# Patient Record
Sex: Female | Born: 1975
Health system: Southern US, Community
[De-identification: ages and names within clinical notes are randomized; demographics above are authoritative.]

## PROBLEM LIST (undated history)

## (undated) DIAGNOSIS — G8929 Other chronic pain: Secondary | ICD-10-CM

## (undated) DIAGNOSIS — K219 Gastro-esophageal reflux disease without esophagitis: Secondary | ICD-10-CM

## (undated) DIAGNOSIS — M549 Dorsalgia, unspecified: Secondary | ICD-10-CM

## (undated) DIAGNOSIS — J40 Bronchitis, not specified as acute or chronic: Secondary | ICD-10-CM

## (undated) DIAGNOSIS — L309 Dermatitis, unspecified: Secondary | ICD-10-CM

## (undated) DIAGNOSIS — F329 Major depressive disorder, single episode, unspecified: Secondary | ICD-10-CM

## (undated) DIAGNOSIS — F32A Depression, unspecified: Secondary | ICD-10-CM

## (undated) HISTORY — PX: LEG SURGERY: SHX1003

---

## 2002-08-04 ENCOUNTER — Emergency Department (HOSPITAL_COMMUNITY): Admission: EM | Admit: 2002-08-04 | Discharge: 2002-08-04 | Payer: Self-pay | Admitting: Emergency Medicine

## 2002-10-19 ENCOUNTER — Emergency Department (HOSPITAL_COMMUNITY): Admission: EM | Admit: 2002-10-19 | Discharge: 2002-10-19 | Payer: Self-pay | Admitting: Emergency Medicine

## 2002-10-23 ENCOUNTER — Ambulatory Visit (HOSPITAL_COMMUNITY): Admission: RE | Admit: 2002-10-23 | Discharge: 2002-10-23 | Payer: Self-pay

## 2002-11-02 ENCOUNTER — Emergency Department (HOSPITAL_COMMUNITY): Admission: EM | Admit: 2002-11-02 | Discharge: 2002-11-02 | Payer: Self-pay | Admitting: Emergency Medicine

## 2002-11-04 ENCOUNTER — Inpatient Hospital Stay (HOSPITAL_COMMUNITY): Admission: AD | Admit: 2002-11-04 | Discharge: 2002-11-04 | Payer: Self-pay

## 2002-12-06 ENCOUNTER — Encounter: Payer: Self-pay | Admitting: *Deleted

## 2002-12-06 ENCOUNTER — Ambulatory Visit (HOSPITAL_COMMUNITY): Admission: RE | Admit: 2002-12-06 | Discharge: 2002-12-06 | Payer: Self-pay

## 2002-12-20 ENCOUNTER — Inpatient Hospital Stay (HOSPITAL_COMMUNITY): Admission: AD | Admit: 2002-12-20 | Discharge: 2002-12-20 | Payer: Self-pay

## 2002-12-29 ENCOUNTER — Inpatient Hospital Stay (HOSPITAL_COMMUNITY): Admission: AD | Admit: 2002-12-29 | Discharge: 2002-12-29 | Payer: Self-pay

## 2003-01-30 ENCOUNTER — Inpatient Hospital Stay (HOSPITAL_COMMUNITY): Admission: AD | Admit: 2003-01-30 | Discharge: 2003-01-30 | Payer: Self-pay

## 2003-03-07 ENCOUNTER — Ambulatory Visit (HOSPITAL_COMMUNITY): Admission: RE | Admit: 2003-03-07 | Discharge: 2003-03-07 | Payer: Self-pay

## 2003-03-27 ENCOUNTER — Encounter: Admission: RE | Admit: 2003-03-27 | Discharge: 2003-03-27 | Payer: Self-pay

## 2003-04-01 ENCOUNTER — Inpatient Hospital Stay (HOSPITAL_COMMUNITY): Admission: AD | Admit: 2003-04-01 | Discharge: 2003-04-01 | Payer: Self-pay

## 2003-04-03 ENCOUNTER — Encounter: Admission: RE | Admit: 2003-04-03 | Discharge: 2003-04-03 | Payer: Self-pay

## 2003-04-07 ENCOUNTER — Inpatient Hospital Stay (HOSPITAL_COMMUNITY): Admission: AD | Admit: 2003-04-07 | Discharge: 2003-04-09 | Payer: Self-pay

## 2003-10-08 ENCOUNTER — Other Ambulatory Visit: Admission: RE | Admit: 2003-10-08 | Discharge: 2003-10-08 | Payer: Self-pay | Admitting: Obstetrics and Gynecology

## 2003-10-23 ENCOUNTER — Ambulatory Visit (HOSPITAL_COMMUNITY): Admission: RE | Admit: 2003-10-23 | Discharge: 2003-10-23 | Payer: Self-pay | Admitting: Obstetrics and Gynecology

## 2004-08-23 ENCOUNTER — Other Ambulatory Visit: Admission: RE | Admit: 2004-08-23 | Discharge: 2004-08-23 | Payer: Self-pay | Admitting: Obstetrics and Gynecology

## 2004-11-25 ENCOUNTER — Emergency Department (HOSPITAL_COMMUNITY): Admission: EM | Admit: 2004-11-25 | Discharge: 2004-11-25 | Payer: Self-pay | Admitting: Emergency Medicine

## 2005-12-17 ENCOUNTER — Emergency Department (HOSPITAL_COMMUNITY): Admission: EM | Admit: 2005-12-17 | Discharge: 2005-12-17 | Payer: Self-pay | Admitting: Emergency Medicine

## 2006-05-05 ENCOUNTER — Emergency Department (HOSPITAL_COMMUNITY): Admission: EM | Admit: 2006-05-05 | Discharge: 2006-05-05 | Payer: Self-pay | Admitting: Family Medicine

## 2006-11-29 ENCOUNTER — Emergency Department (HOSPITAL_COMMUNITY): Admission: EM | Admit: 2006-11-29 | Discharge: 2006-11-29 | Payer: Self-pay | Admitting: Emergency Medicine

## 2006-12-28 ENCOUNTER — Emergency Department (HOSPITAL_COMMUNITY): Admission: EM | Admit: 2006-12-28 | Discharge: 2006-12-28 | Payer: Self-pay | Admitting: Emergency Medicine

## 2007-02-06 ENCOUNTER — Inpatient Hospital Stay (HOSPITAL_COMMUNITY): Admission: EM | Admit: 2007-02-06 | Discharge: 2007-02-09 | Payer: Self-pay | Admitting: Emergency Medicine

## 2007-07-17 ENCOUNTER — Emergency Department (HOSPITAL_COMMUNITY): Admission: EM | Admit: 2007-07-17 | Discharge: 2007-07-17 | Payer: Self-pay | Admitting: Emergency Medicine

## 2007-07-22 ENCOUNTER — Emergency Department (HOSPITAL_COMMUNITY): Admission: EM | Admit: 2007-07-22 | Discharge: 2007-07-22 | Payer: Self-pay | Admitting: Emergency Medicine

## 2007-08-25 ENCOUNTER — Emergency Department (HOSPITAL_COMMUNITY): Admission: EM | Admit: 2007-08-25 | Discharge: 2007-08-25 | Payer: Self-pay | Admitting: Emergency Medicine

## 2007-09-07 ENCOUNTER — Emergency Department (HOSPITAL_COMMUNITY): Admission: EM | Admit: 2007-09-07 | Discharge: 2007-09-07 | Payer: Self-pay | Admitting: Emergency Medicine

## 2007-09-28 ENCOUNTER — Emergency Department (HOSPITAL_COMMUNITY): Admission: EM | Admit: 2007-09-28 | Discharge: 2007-09-29 | Payer: Self-pay | Admitting: Emergency Medicine

## 2007-10-15 ENCOUNTER — Emergency Department (HOSPITAL_COMMUNITY): Admission: EM | Admit: 2007-10-15 | Discharge: 2007-10-15 | Payer: Self-pay | Admitting: Emergency Medicine

## 2008-06-04 ENCOUNTER — Inpatient Hospital Stay (HOSPITAL_COMMUNITY): Admission: EM | Admit: 2008-06-04 | Discharge: 2008-06-10 | Payer: Self-pay | Admitting: Emergency Medicine

## 2008-06-04 ENCOUNTER — Emergency Department (HOSPITAL_BASED_OUTPATIENT_CLINIC_OR_DEPARTMENT_OTHER): Admission: EM | Admit: 2008-06-04 | Discharge: 2008-06-04 | Payer: Self-pay | Admitting: Emergency Medicine

## 2008-06-27 ENCOUNTER — Emergency Department (HOSPITAL_COMMUNITY): Admission: EM | Admit: 2008-06-27 | Discharge: 2008-06-28 | Payer: Self-pay | Admitting: Emergency Medicine

## 2008-07-15 ENCOUNTER — Emergency Department (HOSPITAL_COMMUNITY): Admission: EM | Admit: 2008-07-15 | Discharge: 2008-07-15 | Payer: Self-pay | Admitting: Emergency Medicine

## 2009-06-14 ENCOUNTER — Observation Stay (HOSPITAL_COMMUNITY): Admission: EM | Admit: 2009-06-14 | Discharge: 2009-06-14 | Payer: Self-pay | Admitting: Emergency Medicine

## 2009-07-08 ENCOUNTER — Observation Stay (HOSPITAL_COMMUNITY): Admission: EM | Admit: 2009-07-08 | Discharge: 2009-07-09 | Payer: Self-pay | Admitting: Emergency Medicine

## 2009-11-15 IMAGING — CT CT ANGIO CHEST
2 of 5 series · 19 of 36 positions shown · IV contrast (omnipaque)
Comparison: Chest x-ray of same date.

CLINICAL DATA: Shortness of breath.  Recent leg surgery.

CT ANGIOGRAPHY CHEST
TECHNIQUE: Multidetector CT imaging of the chest using the
standard protocol during bolus administration of intravenous
contrast. Multiplanar reconstructed images obtained and reviewed to
evaluate the vascular anatomy.
Contrast: 100 ml Omnipaque 300

[Series 7: pulm embolism 1.0 thins · axial · 0.59mm/px · z∈[-226,+17]mm · 16 of 273 slices shown]
[im 15/273  lung]
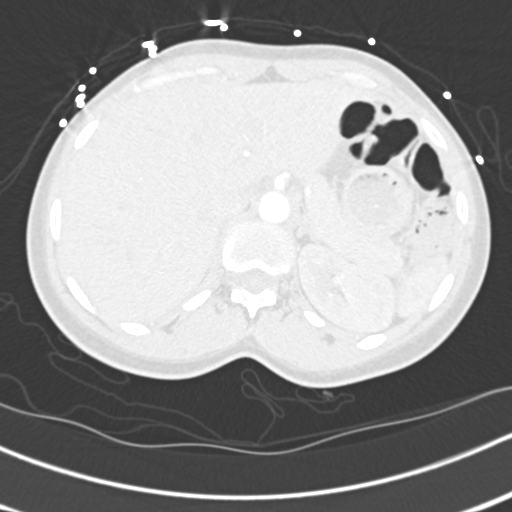
[im 29/273  mediastinal]
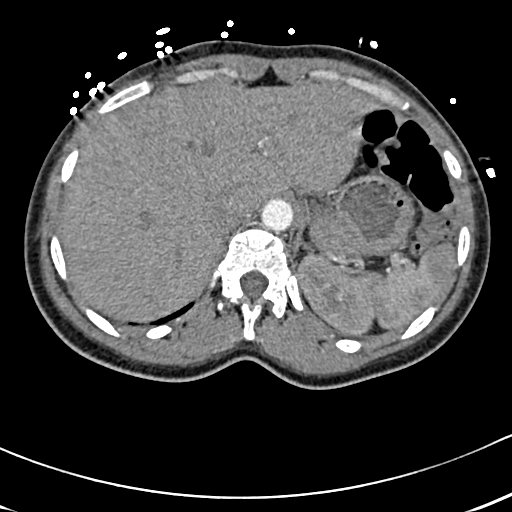
[im 43/273  lung]
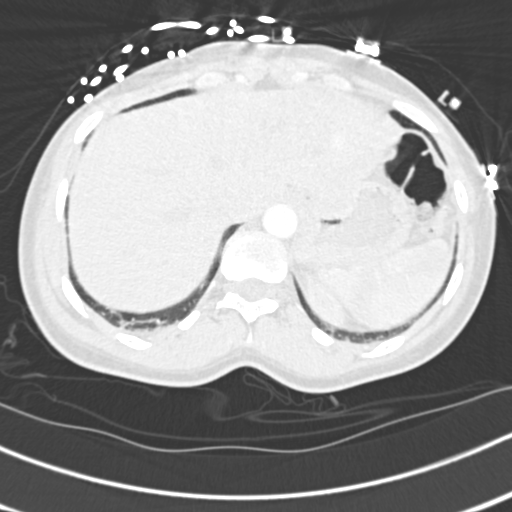
[im 58/273  mediastinal]
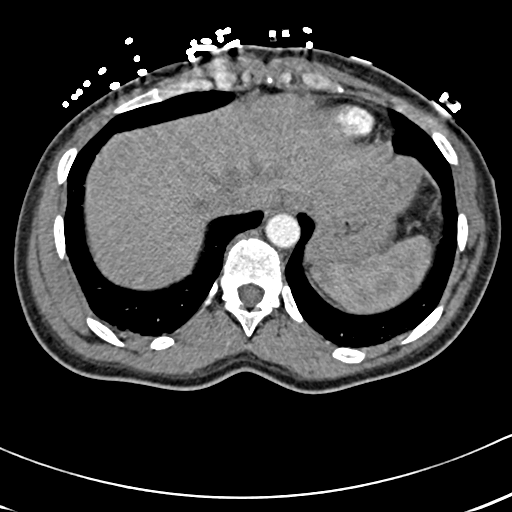
[im 86/273  lung]
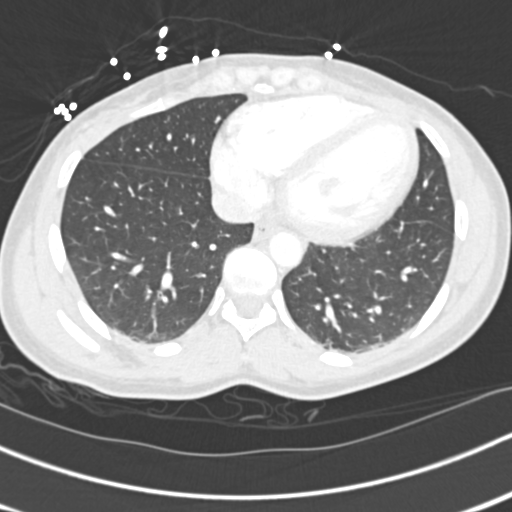
[im 101/273  mediastinal]
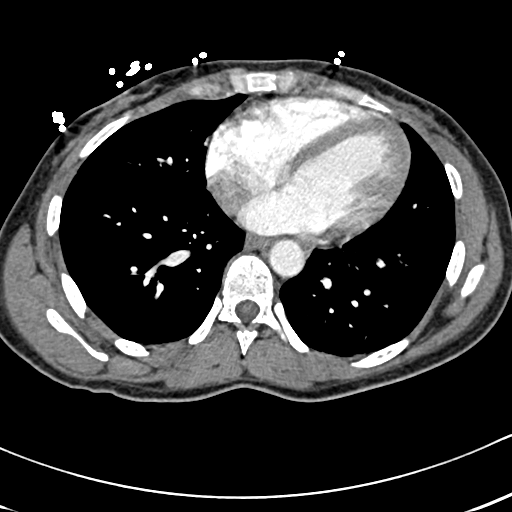
[im 115/273  lung]
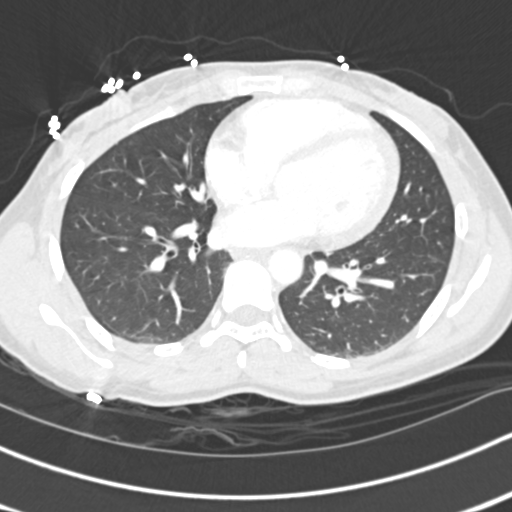
[im 129/273  mediastinal]
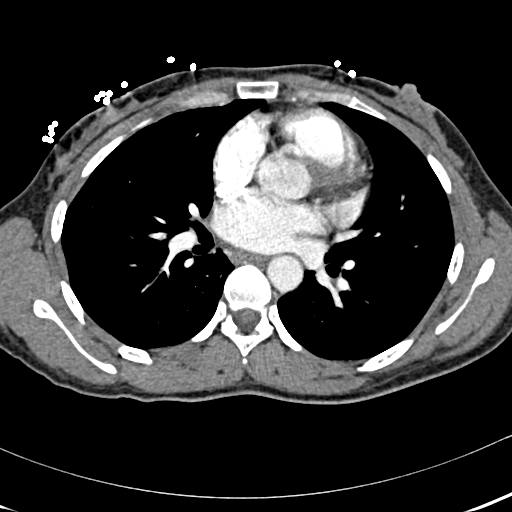
[im 144/273  lung]
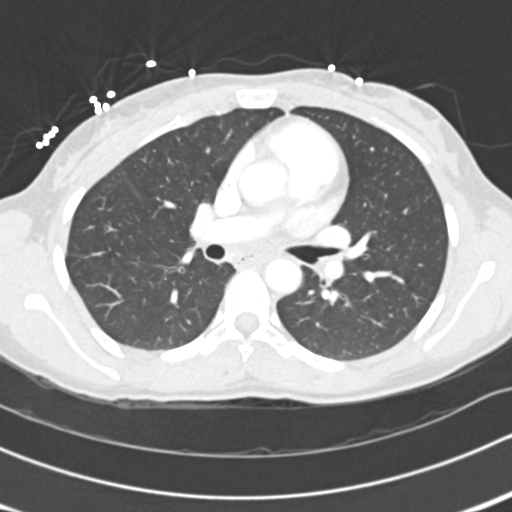
[im 158/273  mediastinal]
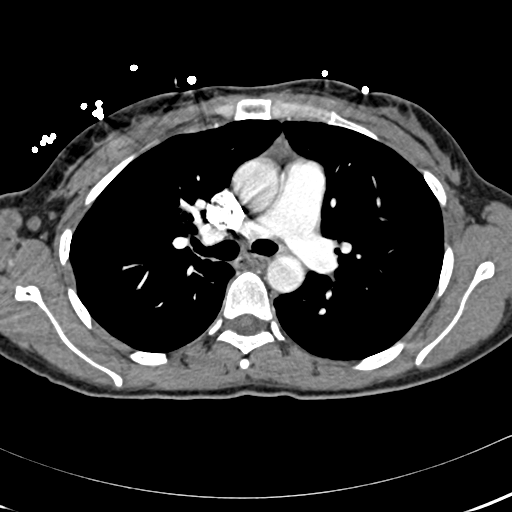
[im 172/273  lung]
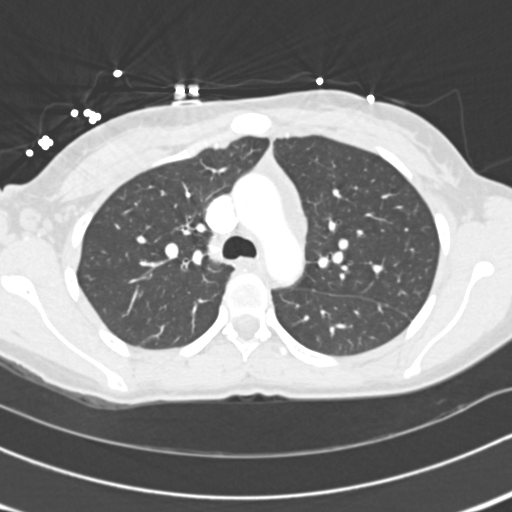
[im 187/273  mediastinal]
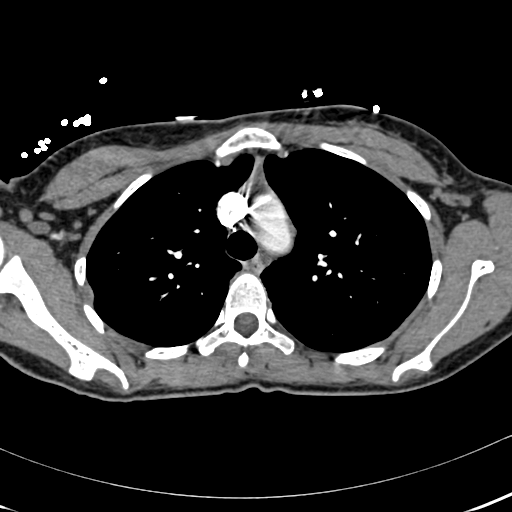
[im 215/273  lung]
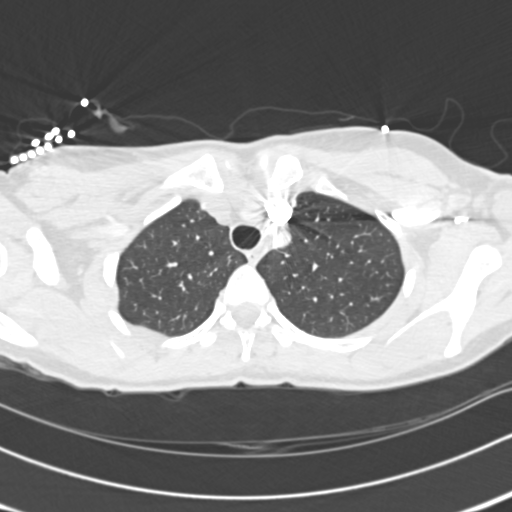
[im 230/273  mediastinal]
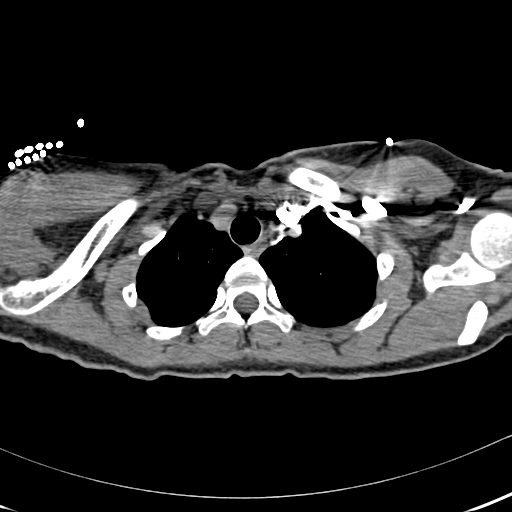
[im 244/273  lung]
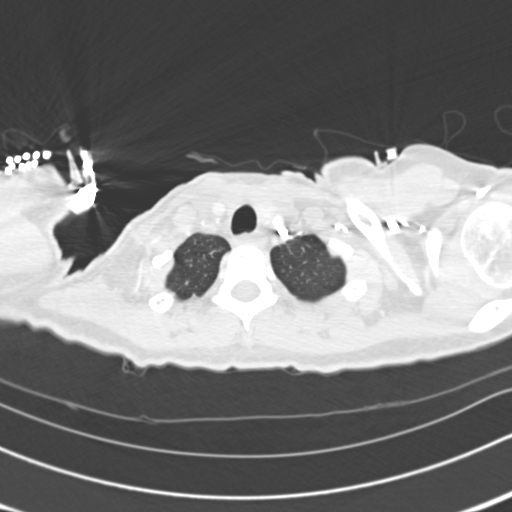
[im 258/273  mediastinal]
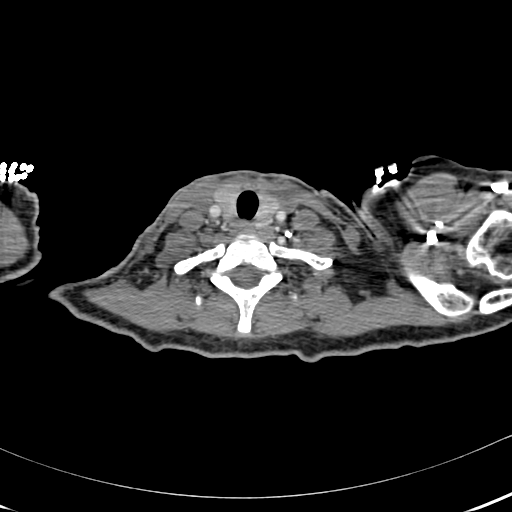

[Series 9: pulm embolism 2.0 cor · coronal · 0.62mm/px · 3 of 97 slices shown]
[im 20/97  mediastinal]
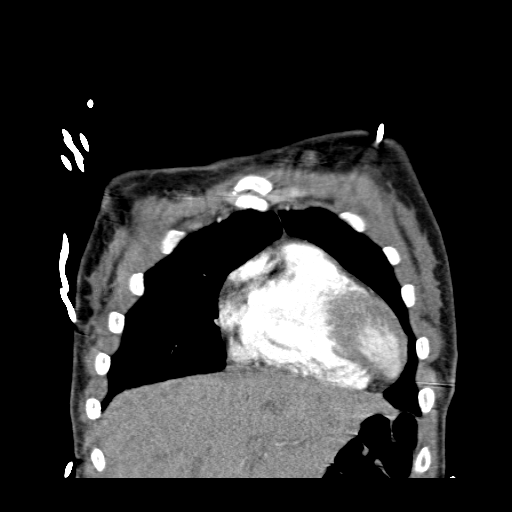
[im 39/97  mediastinal]
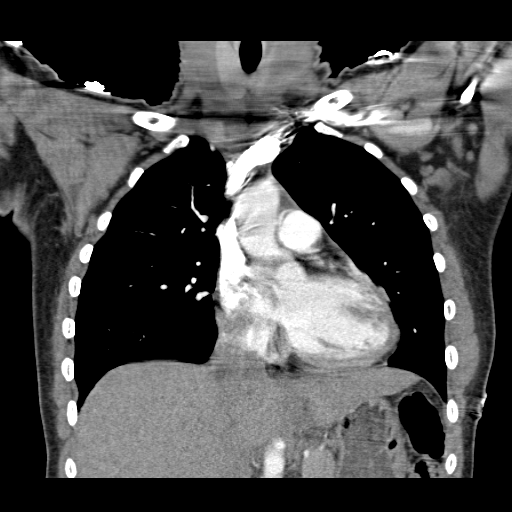
[im 58/97  mediastinal]
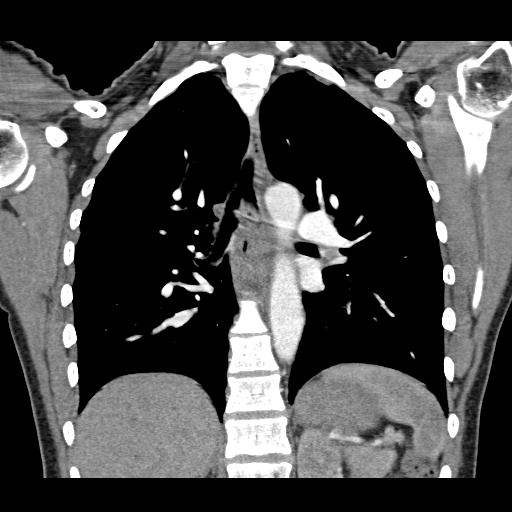

[19 of 36 positions shown; findings below may reference images not displayed]

FINDINGS: There is satisfactory opacification of the pulmonary
arterial system.  No focal filling defect is present to suggest
pulmonary embolus.  The heart size is normal.  There is no
significant pleural or pericardial effusion.  The limited imaging
of the upper abdomen is unremarkable.

Lung windows reveal no focal nodule, mass, or airspace disease.
Minimal dependent atelectasis is seen bilaterally.
IMPRESSION: 1.  No evidence of pulmonary embolus.
2.  No acute or focal abnormality to explain shortness of breath.

## 2010-10-13 ENCOUNTER — Emergency Department (HOSPITAL_COMMUNITY): Admission: EM | Admit: 2010-10-13 | Discharge: 2010-10-13 | Payer: Self-pay | Admitting: Emergency Medicine

## 2010-12-10 ENCOUNTER — Emergency Department (HOSPITAL_COMMUNITY)
Admission: EM | Admit: 2010-12-10 | Discharge: 2010-12-10 | Payer: Self-pay | Source: Home / Self Care | Admitting: Emergency Medicine

## 2010-12-25 ENCOUNTER — Emergency Department (HOSPITAL_COMMUNITY)
Admission: EM | Admit: 2010-12-25 | Discharge: 2010-12-25 | Disposition: A | Discharge status: Home / Self Care | Payer: Self-pay | Attending: Emergency Medicine | Admitting: Emergency Medicine

## 2010-12-25 DIAGNOSIS — R51 Headache: Secondary | ICD-10-CM | POA: Insufficient documentation

## 2010-12-25 DIAGNOSIS — R220 Localized swelling, mass and lump, head: Secondary | ICD-10-CM | POA: Insufficient documentation

## 2010-12-25 DIAGNOSIS — R21 Rash and other nonspecific skin eruption: Secondary | ICD-10-CM | POA: Insufficient documentation

## 2010-12-25 DIAGNOSIS — R221 Localized swelling, mass and lump, neck: Secondary | ICD-10-CM | POA: Insufficient documentation

## 2010-12-25 DIAGNOSIS — J45909 Unspecified asthma, uncomplicated: Secondary | ICD-10-CM | POA: Insufficient documentation

## 2010-12-25 LAB — POCT I-STAT, CHEM 8
HCT: 45 % (ref 36.0–46.0)
Potassium: 3.8 mEq/L (ref 3.5–5.1)
Sodium: 141 mEq/L (ref 135–145)

## 2010-12-25 LAB — DIFFERENTIAL
Basophils Relative: 0 % (ref 0–1)
Eosinophils Relative: 16 % — ABNORMAL HIGH (ref 0–5)
Lymphs Abs: 1.3 10*3/uL (ref 0.7–4.0)
Monocytes Relative: 5 % (ref 3–12)
Neutro Abs: 6.3 10*3/uL (ref 1.7–7.7)

## 2010-12-25 LAB — CBC
HCT: 41.8 % (ref 36.0–46.0)
Hemoglobin: 13.7 g/dL (ref 12.0–15.0)
MCHC: 32.8 g/dL (ref 30.0–36.0)
RBC: 4.81 MIL/uL (ref 3.87–5.11)
RDW: 13.5 % (ref 11.5–15.5)
WBC: 9.7 10*3/uL (ref 4.0–10.5)

## 2011-01-04 NOTE — Consult Note (Signed)
[name], Bass              ACCOUNT NO.:  [id number]  MEDICAL RECORD NO.:  [id number]           PATIENT TYPE:  E  LOCATION:  WLED                         FACILITY:  [facility]  PHYSICIAN:  Jeoffrey Massed, MD    DATE OF BIRTH:  Nov 13, 1976  DATE OF CONSULTATION: DATE OF DISCHARGE:                                CONSULTATION   PRIMARY CARE PHYSICIAN:  Fleet Contras, M.D.  PRIMARY DERMATOLOGIST:  Elinor Parkinson. Terri Piedra, M.D.  CHIEF COMPLAINT:  Itching, facial rash, rash on trunk for the past 2 to 3 weeks.  HISTORY OF PRESENT ILLNESS:  [name] is a 35 year old African- American female who does have a past medical history of urticaria, eczema, anxiety and also depression.  She presented to the [facility] Emergency Room early this morning for a rash on the face, on her back, arms and also the front side of her trunk.  Apparently this has been going on and off for the past 2 to 3 weeks.  She states that the rash is intensely pleuritic in nature.  She claims that she has some desquamation of her skin, mostly in the facial region, as well.  She claims she on and off gets a watery discharge from her facial region as well.  She claims that the skin is very dry and it has now become very sensitive to even the clothes that she is wearing.  She has currently very minimal facial swelling and she claims that it has actually gotten even much better than what it was in the past week.  She has no tongue swelling or lip swelling.  She has no difficulty breathing.  She has no stridor or rhonchi on exam.  She denies any fever.  She has occasional headaches.  There is no chest pain, shortness of breath, nausea, vomiting or diarrhea.  The rash does not involve her lower extremities at all.  This patient apparently sees an allergy specialist as well as Dr. Terri Piedra, who is a dermatologist.  However, she had last follow up with Dr. Terri Piedra for almost one year and her last visit to her allergist was  almost 2 to 3 months ago.  Currently she has received numerous medications in the emergency room including Solu-Medrol, Benadryl and Pepcid and claims to feel slightly better than what she came in with.  ALLERGIES:  SHE HAS NO KNOWN ALLERGIES TO ANY MEDICATIONS.  PAST MEDICAL HISTORY: 1. Eczema. 2. History of atopic dermatitis. 3. History of bronchial asthma with allergic rhinitis. 4. Anxiety with depression.  PAST SURGICAL HISTORY:  She has had surgery on the left leg area secondary to fracture.  MEDICATIONS:  She is on some prescription creams.  She does not know the name of which.  FAMILY HISTORY:  There is no history of skin disorders or dermatological conditions in the family.  SOCIAL HISTORY:  She denies any toxic habits.  REVIEW OF SYSTEMS:  A detailed review of 10 systems was done and these are negative except for the ones mentioned in the HPI.  PHYSICAL EXAMINATION:  VITAL SIGNS:  Temperature of 98.1, heart rate of 91, blood pressure 127/79, pulse  oximetry of 100% on room air. GENERAL EXAMINATION:  A young African American female sitting in bed, does not appear to be in any distress.  There is no stridor.  There is no wheezing.  She appears comfortable.  However, she does appear to have extremely dry skin on the face with some desquamation.  There is minimal to no facial swelling on my exam. HEENT:  Atraumatic, normocephalic.  Pupils equally react to light and accommodation.  Oral mucosa is moist.  There is no sloughing ulcers in the oral mucosa.  She does have some dental caries.  Her posterior pharyngeal wall is noninflamed, nonerythematous.  There is no involvement of the posterior pharyngeal wall or the oral mucosa at all. NECK:  Supple. CHEST:  Bilaterally clear to auscultation.  There is no stridor.  There is no rhonchi.  There is good air entry bilaterally. CARDIOVASCULAR:  Heart sounds are regular.  No murmurs heard. ABDOMEN:  Soft, nontender,  nondistended. NEUROLOGIC:  The patient is alert and oriented x3.  She has intact sensation in all of the relevant areas.  She does not have any focal motor deficits. SKIN:  The patient does have a dark to almost a reddish tint to her skin mostly involving her face, her trunk and her bilateral upper extremities up to her proximal forearm region.  As noted above, there is no involvement of any of her mucosal region.  There is no involvement of her eyes.  Especially in the face there is evidence of desquamation. There is no involvement of her lower extremities.  There is involvement up to her proximal abdominal region.  The skin is dry all over with no evidence of any weeping.  Please note that there are areas in her torso and in her bilateral upper extremities that have only patchy involvement. EXTREMITIES:  There is no edema.  She does not have cyanosis.  She has warm bilateral lower extremities and has good pulses both in the dorsalis pedis arteries.  LABORATORY DATA: 1. WBC is 9.7, hemoglobin of 13.7, hematocrit of 41.8 and a platelet     count of 270. 2. Chemistry shows a sodium of 141, potassium of 3.8, chloride of 106,     glucose of 76, BUN of 9 and a creatinine of 0.9.  RADIOLOGY:  None done here in the emergency room.  SUMMARY:  This is a 35 year old female with a longstanding history of dermatological issues who presents to the ED with pruritus, a dark/red depigmentation/rash of mostly her face, torso and bilateral upper extremities with no involvement of her mucosal areas.  She does not have any airway issues at all.  She has no stridor.  Her vitals are stable. Her laboratory data is also stable.  Please note, a review of the E- Chart showed a similar presentation to the Emergency Department at [facility] on December 11, 2010, where she was treated with steroids and Benadryl.  Please also note there is no lip or tongue swelling at all.  ASSESSMENT: 1. Eczema/dermatitis.   There is no evidence of angioedema, as she does     not have any oral involvement at all.  Her tongue and lips are not     swollen.  The airway is not an issue at all. 2. History of depression and anxiety disorder.  RECOMMENDATIONS: 1. Would continue giving steroids and Benadryl along with Pepcid to     block both her H1 and H2 receptors.  Would suggest continued  observation in the emergency room.  This patient needs a     Rheumatology evaluation and she does have her own dermatologist who     she should see upon discharge from the emergency room as soon as     possible.  Since her vitals and her laboratory data are stable and     it appears that this issue has been going on for the past 2 to 3     weeks, I suggest this patient be discharged on a trial of steroids,     Pepcid and Benadryl.  She should continue using her regular     emollients as given by her dermatologist and would try and make an     appointment with the dermatologist as soon as possible. 2. She should be advised to report to the ER if she has worsening of     her facial swelling or any swelling involving her tongue or her lip     region. 3. At this point, our recommendations are to continue observation in     the emergency room and if she continues to do well she should     likely be discharged from the ED itself and should not require a     medical admission.  However, if she does continue to have any     involvement of her oral mucosa or lip or tongue swelling please do     contact us and we will gladly admit this patient.  Thank you for the consultation.     Jeoffrey Massed, MD     SG/MEDQ  D:  12/25/2010  T:  12/25/2010  Job:  [id number]  cc:   Fleet Contras, M.D. Fax: [phone]  Frederick A. Worthy Rancher, M.D. Fax: [phone]  Electronically Signed by Jeoffrey Massed  on 12/29/2010 04:33:48 PM

## 2011-02-26 LAB — BASIC METABOLIC PANEL
BUN: 14 mg/dL (ref 6–23)
CO2: 20 mEq/L (ref 19–32)
Calcium: 8.3 mg/dL — ABNORMAL LOW (ref 8.4–10.5)
Chloride: 112 mEq/L (ref 96–112)
GFR calc non Af Amer: >60 mL/min (ref >60)
Glucose, Bld: 162 mg/dL — ABNORMAL HIGH (ref 70–99)
Potassium: 4.3 mEq/L (ref 3.5–5.1)

## 2011-02-26 LAB — CBC
HCT: 30.8 % — ABNORMAL LOW (ref 36.0–46.0)
HCT: 34.9 % — ABNORMAL LOW (ref 36.0–46.0)
MCHC: 32.6 g/dL (ref 30.0–36.0)
Platelets: 219 10*3/uL (ref 150–400)
RDW: 13.2 % (ref 11.5–15.5)
WBC: 12.1 10*3/uL — ABNORMAL HIGH (ref 4.0–10.5)

## 2011-02-26 LAB — URINE MICROSCOPIC-ADD ON

## 2011-02-26 LAB — URINALYSIS, ROUTINE W REFLEX MICROSCOPIC
Glucose, UA: NEGATIVE mg/dL
Hgb urine dipstick: NEGATIVE
Ketones, ur: 15 mg/dL — AB
Urobilinogen, UA: 1 mg/dL (ref 0.0–1.0)
pH: 5.5 (ref 5.0–8.0)

## 2011-02-26 LAB — URINE CULTURE: Colony Count: NO GROWTH

## 2011-02-26 LAB — LACTIC ACID, PLASMA: Lactic Acid, Venous: 2.3 mmol/L — ABNORMAL HIGH (ref 0.5–2.2)

## 2011-04-05 NOTE — Op Note (Signed)
NAMESARIYA, TRICKEY              ACCOUNT NO.:  [id number]   MEDICAL RECORD NO.:  [id number]          PATIENT TYPE:  INP   LOCATION:  5004                         FACILITY:  [facility]   PHYSICIAN:  Eulas Post, MD    DATE OF BIRTH:  07-Oct-1976   DATE OF PROCEDURE:  06/04/2008  DATE OF DISCHARGE:                               OPERATIVE REPORT   ATTENDING PHYSICIAN:  Eulas Post, MD   FIRST ASSISTANT:  None.   PREOPERATIVE DIAGNOSIS:  Opened grade 1 left tibia and fibular fracture.   POSTOPERATIVE DIAGNOSIS:  Opened grade 1 left tibia and fibular  fracture.   OPERATIVE PROCEDURE:  1. I&D of the left fibula open grade 1 fracture.  2. Intramedullary nailing of the left tibial fracture.   ANESTHESIA:  General.   ESTIMATED BLOOD LOSS:  150 mL.   ANTIBIOTICS:  Intravenous Ancef 1 g given preoperatively in the  emergency room and then another 1 g given preoperatively in the  operating room.   OPERATIVE IMPLANTS:  Synthes size 9 tibial nail with a length of 360 mm  and a total of 2 distal interlocking screws and 1 proximal static  locking screw.   PREOPERATIVE INDICATIONS:  Ms. Unknown Flannigan is a 35 year old woman who  was at work today at [employer] when she got her leg caught in between  the forklift and the wall.  She had an open grade 1 fibular fracture  with an associated tibial fracture, looked like the sharp jagged edge of  the fibula poked out through the skin.  The patient was seen at the  Urgent Care Facility at [facility] and transferred for emergent I&D at  [facility].  The risks, benefits, and alternatives were discussed  preoperatively including but not limited to risks of infection,  bleeding, nerve injury, malunion, nonunion, skin breakdown, hardware  prominence, hardware failure, the need for hardware removal,  cardiopulmonary complications among others and including knee pain and  blood clots, and she is willing to proceed.   OPERATIVE PROCEDURE:  The  patient was brought to the operating room and  placed in supine position.  General anesthesia was administered.  Intravenous Ancef 1 g was given.  The left lower extremity was prepped  and draped in the usual sterile fashion.  A small incision was made over  the fibular fracture.  The fibular fracture was proximal to the ankle  joint and proximal to the syndesmosis, and therefore did not require  fixation; however, there was a small spike of bone that had penetrated  through the skin.  This was irrigated copiously.  We washed with a total  of 6 L through the open fracture.  We then turned our attention to the  tibia.    An incision was made over the distal aspect of the patella, and medial  parapatellar approach was performed.  A guide pin was placed.  We opened  the tibia and then reamed over that and then placed a guidewire down  across her fracture site.  We held our fracture reduced anatomically  while we reamed up to  a 10 and placed a size 9 nail.  We locked the nail  distally and initially backslapped the nail.  We then locked the nail  proximally; however, I was unsatisfied with the fact that the nail  seemed to be proximal to the articular surface at the joint, so removed  the proximal interlocking screws, and I also felt that I had over  impacted the fracture site; so I placed the nail further down and  allowed anatomic length to the tibia to be restored, in order to  maintain appropriate lengths to the hardware was not prominent  proximally.  We then locked the nail in the static 2-hole.  Excellent  alignment was achieved and rotational alignment was confirmed by direct  visualization.  We irrigated all the wounds copiously.  Final x-rays  were taken.  We closed the deep tissue with 0 Vicryl followed by 3-0  followed by Monocryl for the portals, for the screw holes, and for the  skin.  The wounds were dressed with Steri-Strips and sterile gauze, and  a light compression wrap.   The patient was awakened and returned to PACU  in stable and satisfactory condition.  There were no complications.  The  patient tolerated the procedure well.      Eulas Post, MD  Electronically Signed     JPL/MEDQ  D:  06/04/2008  T:  06/05/2008  Job:  [id number]

## 2011-04-05 NOTE — H&P (Signed)
[name], [name]              ACCOUNT NO.:  [id number]   MEDICAL RECORD NO.:  [id number]          PATIENT TYPE:  INP   LOCATION:  5004                         FACILITY:  [facility]   PHYSICIAN:  Eulas Post, MD    DATE OF BIRTH:  10-07-1976   DATE OF ADMISSION:  06/04/2008  DATE OF DISCHARGE:                              HISTORY & PHYSICAL   CHIEF COMPLAINT:  Left leg pain.   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  She was seen in urgent care at [facility]  and transferred to [facility] for acute management.  She complains of  severe leg pain and denies any pain in any other location.  It is  located directly over her distal tibia.  Pain medications have made it  better and any activity makes it worse.   PAST MEDICAL HISTORY:  Significant for eczema as well as asthma.   FAMILY HISTORY:  She has multiple aunts and uncles with diabetes but no  immediate relatives who have diabetes or cardiac disease.   SOCIAL HISTORY:  She is a nonsmoker and works at [employer].   REVIEW OF SYSTEMS:  She did complain of the musculoskeletal complaints  in the HPI.  All other review of systems were negative.  A 14-point  review of systems was performed.   PHYSICAL EXAMINATION:  GENERAL:  She is lying in a gurney in mild  distress.  She is alert and oriented.  NECK:  She has full range of motion with no radiculopathy.  She has a  midline trachea.  Her oropharynx is clear.  EYES:  Extraocular movements are intact.  SKIN:  She has multiple skin abnormalities from depigmentation areas  around her arms as well as chronic eczematous changes.  She also has a  laceration near her left fibular fracture.  PSYCHIATRIC:  She is appropriate throughout her interaction and her  judgment and insight seem  to be intact and she is competent for medical  decision making.  GI:  Her abdomen is soft, nontender, and there are no masses.  RESPIRATORY:  She has no cyanosis and no increase in respiratory  efforts.  CARDIOVASCULAR:  She has edema over the left leg but no peripheral edema  on the right leg.  She has a regular rate and rhythm.  NEUROLOGIC:  Her sensation is intact throughout her toes.  MUSCULOSKELETAL:  She has gross deformity of the left lower extremity  with pain to palpation in this region.  EHL and FHL are firing.   Her x-rays demonstrate evidence of a distal third tibia and fibula  fracture with some displacement.   IMPRESSION:  Left grade 1 open tibia and fibula fracture.  It appears  like the tibia is closed but the fibula is open.  The risks, benefits,  and alternatives have been discussed with her.  We are going to give  her  antibiotics and proceed with emergent incision and drainage and  intramedullary nailing.  We discussed the risks, benefits, and  alternatives including but not limited to the risks of infection,  bleeding, nerve injury, malunion, nonunion, hardware failure,  hardware prominence, knee pain, the need for hardware removal,  cardiopulmonary complications among others and she is willing to  proceed.  She had preoperative laboratories which demonstrated normal  renal function and we will get her optimized up to surgery as soon as  possible.      Eulas Post, MD  Electronically Signed     JPL/MEDQ  D:  06/04/2008  T:  06/05/2008  Job:  [id number]

## 2011-04-05 NOTE — Discharge Summary (Signed)
NAMEARIEONA, SWAGGERTY              ACCOUNT NO.:  [id number]   MEDICAL RECORD NO.:  [id number]          PATIENT TYPE:  INP   LOCATION:  5004                         FACILITY:  [facility]   PHYSICIAN:  Eulas Post, MD    DATE OF BIRTH:  [birth year]   DATE OF ADMISSION:  06/04/2008  DATE OF DISCHARGE:  06/09/2008                               DISCHARGE SUMMARY   DATE OF ANTICIPATED DISCHARGE:  June 09, 2008.   ADMISSION DIAGNOSIS:  Left open tibia and fibular fracture, grade 1.   DISCHARGE DIAGNOSIS:  Left open tibia and fibular fracture, grade 1.   PRIMARY PROCEDURES:  I&D of the left tibia and fibula with  intramedullary nailing.   DISCHARGE MEDICATIONS:  1. Dilaudid 2 mg p.o. q.6 h. p.r.n. pain.  2. Percocet.  3. Valium.  4. Phenergan.  5. Aspirin.   HOSPITAL COURSE:  Ms. Anwar Crill is a 35 year old woman who had her  leg caught in a forklift.  She had an open grade 1 tib-fib fracture.  She underwent emergent I&D and intramedullary nailing.  She tolerated  the procedure well and postoperatively was given antibiotics in the form  of Ancef.  She was also given a PCA for pain control as well as  sequential compression devices and Lovenox for DVT prophylaxis.  She is  planned to be discharged home on aspirin and she passed physical therapy  and her pain was controlled on p.o. analgesics.  She is going to come  back and see me in 2 weeks.  She benefited maximally from her hospital  stay.  She is going to be off work for probably 3 months.      Eulas Post, MD  Electronically Signed     JPL/MEDQ  D:  06/09/2008  T:  06/09/2008  Job:  [id number]

## 2011-04-05 NOTE — H&P (Signed)
NAMEMADAILEIN, LONDO              ACCOUNT NO.:  [id number]   MEDICAL RECORD NO.:  [id number]          PATIENT TYPE:  INP   LOCATION:  5523                         FACILITY:  [facility]   PHYSICIAN:  Fleet Contras, M.D.    DATE OF BIRTH:  03-Sep-1976   DATE OF ADMISSION:  07/08/2009  DATE OF DISCHARGE:                              HISTORY & PHYSICAL   PRESENTING COMPLAINTS:  Hives.   HISTORY OF PRESENT ILLNESS:  Ms. Mastandrea is a 35 year old African  American lady with past medical history significant for urticaria and  hives, bronchial asthma, allergic rhinitis, eczema, ectopic dermatitis,  recurrent angioedema, anxiety and depression.  She presented to the  emergency room at [facility] with 1-week history of hives  initially distributed all over her body and she was evaluated in the  emergency room on June 14, 2009 and treated with a combination of  steroids and antihistamines with some improvement in the rash involving  her arms, legs, and trunk but those on her face did not resolve.  Today,  she was feeling fatigue and tired and decided to come to the emergency  room.  She did not have any fevers or chills.  She denied having any  sores or swelling on her lips or tongue or inside her mouth.  She had no  trouble breathing.  She had no chest pain, orthopnea, PND, or  palpitations.  She did have some blisters on her scalp which were  leaking some fluid.  She had no headaches, dizziness, blurring of  vision, weakness of extremities, or slurring of speech.  She had no  nausea, vomiting, diarrhea, or constipation.  At the emergency room, she  was noted to have hives involving her face with swelling over the  forehead.  She was also noted to have low blood pressure in the 80s and  90s and had received intravenous steroids as well as intravenous fluids  with some improvement but she is not stable enough for her to be  discharged home.  She had been admitted to the hospital for close  monitoring and further treatment.   PAST MEDICAL HISTORY:  1. Recurrent hives, urticaria, and angioedema.  2. Eczema with atopic dermatitis.  3. Bronchial asthma with allergic rhinitis.  4. Anxiety with depression.   MEDICATIONS:  1. Albuterol HFA 2 puffs q.6 p.r.n.  2. Zantac 150 mg b.i.d.  3. Hydroxyzine 25 mg q.6 p.r.n.  4. Xyzal 5 mg b.i.d.  5. Lorazepam 2 mg at bedtime p.r.n. for sleep.   ALLERGIES:  She has no known drug allergies.   FAMILY AND SOCIAL HISTORY:  Her father is healthy.  Her mother is  partially blind.  She has no siblings.  She has two children who are  grown and live with her parents.  She denies any use of alcohol,  tobacco, or illicit drugs.  She currently lives by herself and she works  as an Geophysicist/field seismologist.   PAST SURGICAL HISTORY:  She suffered a recent fracture of her left tibia  and fibula which was treated with a cast.   REVIEW OF SYSTEMS:  Essentially as above.   PHYSICAL EXAMINATION:  GENERAL:  She is lying comfortably in bed not in  acute respiratory or painful distress.  She is not pale.  She is not  icteric.  She is not cyanosed.  She is well-hydrated.  VITAL SIGNS:  Blood pressure of 95/57, heart rate of 79 and regular,  respiratory rate of 13, and O2 sats on 2 L of nasal cannula is 100%.  HEENT:  There is obvious blistering along the edge of the forehead with  no drainage.  These areas are swollen and tender.  There are no obvious  rashes at the moment.  The lips and oral mucosa are clear of any  ulcerations or swelling.  NECK:  Supple with no elevated JVD.  No cervical lymphadenopathy.  No  carotid bruit.  CHEST:  Good air entry bilaterally with no rales, rhonchi, or wheezes.  HEART:  Heart sounds one and two are heard with no murmurs, no S3  gallops.  ABDOMEN:  Soft, nontender, and no masses.  Bowel sounds are present.  EXTREMITIES:  No edema, calf tenderness, or swelling.  There is  hypopigmentation of the right elbow forearm.  CNS:  She  is alert and oriented x3 with no focal neurological deficits.   LATEST LABORATORY DATA:  Sodium of 138, potassium 4.2, chloride 112,  bicarbonate 20, glucose 162, BUN 14, and creatinine 0.91.  White count  is 10, hemoglobin 11.4, hematocrit 34.9, and platelet count of 326.  Lactic acid is 2.3.  Urinalysis is negative for nitrites or leukocytes.   ASSESSMENT:  Ms. Congetta Odriscoll is a 35 year old lady with medical  problems as stated above presenting with recurrent hives, urticaria, and  mild hypotension.  She has been admitted to the hospital for close  monitoring.   HOSPITAL ADMISSION DIAGNOSES:  1. Hypotension.  2. Anaphylactic reaction with angioedema.  3. Folliculitis of the anterior scalp.   PLAN OF CARE:  She will be admitted to a telemetry bed.  She will be  monitored with vital signs q.4 h. to keep systolic blood pressure above  80 mmHg.  She will be on IV Solu-Medrol 50 mg q.6 h., Benadryl 50 mg q.6  h., Protonix 40 mg p.o. daily, subcutaneous Lovenox 40 mg daily for DVT  prophylaxis, and doxycycline 100 mg p.o. b.i.d.  Strict Is and Os will  be monitored and labs will repeated in the a.m.  if she is stable, she  will be discharged to home.  This plan of care has been discussed with  her and her questions answered.      Fleet Contras, M.D.  Electronically Signed     EA/MEDQ  D:  07/08/2009  T:  07/09/2009  Job:  [id number]

## 2011-04-08 NOTE — Discharge Summary (Signed)
Danielle Bass, Danielle Bass              ACCOUNT NO.:  [id number]   MEDICAL RECORD NO.:  [id number]          PATIENT TYPE:  INP   LOCATION:  1323                         FACILITY:  [facility]   PHYSICIAN:  Fleet Contras, M.D.    DATE OF BIRTH:  03-Mar-1976   DATE OF ADMISSION:  02/06/2007  DATE OF DISCHARGE:  02/09/2007                               DISCHARGE SUMMARY   HISTORY OF PRESENT ILLNESS:  The patient is a 34 year old African-  American lady with a past medical history significant for idiopathic  urticaria in her eyes, anxiety disorder, asthma, eczema.  She presented  to the emergency room at [facility] with a 1-day history of  progressive swelling of the face associated with itchy discharge of  eyes.  This started at work.  This patient was exposed to some dust, but  no chemical exposure.  She had no nasal congestion or sneezing.  She had  no swelling of her tongue or oral mucosa.  She had no swelling of the  throat or difficulty in breathing.  She has had previous episodes in the  past treated by an allergist but this is more severe.  At the emergency  room, she was not in any acute respiratory or painful distress.  There  was swelling of the forehead, periorbital region, and upper face, with  puffiness of the eyelids.  There was no erythema or tenderness.  She was  able to open her eyelids with her hands, and eye movements were within  normal limits, and her vision was also normal.  Vital signs were stable.  Ears did show some wax in the left external auditory canal.  The oral  mucosa and tongue were normal.  The neck was supple, with no JVD.  No  carotid bruits.  Chest was clear to auscultation.  Abdomen was full,  soft, nontender.  Bowel sounds were present.  Extremities showed no  edema and no calf tenderness.  CNS:  Alert and oriented x3 with no focal  neurological deficits.   INITIAL LABORATORY DATA:  White count 6.4, hemoglobin 12.1, hematocrit  36.5, platelets 280.   Sodium was 138, potassium 3.8, chloride 106,  bicarbonate 26, BUN 7, creatinine 0.74, glucose 103.   NOTE:  She was therefore admitted to the hospital as a case of  angioedema of unknown etiology.   HOSPITAL COURSE:  On admission, the patient was placed on the medical  bed.  She was started on intravenous Solu-Medrol, oral Benadryl, and  Zantac.  She had a workup done in the office.  TSH, ANA, sed rate, HIV,  RPR were all negative.  Further studies done in the hospital included  CRP, hepatitis-B viral, hepatitis-C viral antibodies, Lyme disease  titers.  She continued to make slow progress. In addition, her steroid  dose was gradually tapered and then changed to oral.  Workup so far  negative.  On 02/09/2007, she was feeling much better.  The swelling had  resolved completely.  Vital signs were stable, and she was considered  stable for discharge.   CONDITION ON DISCHARGE:  Stable.   DISPOSITION:  To home.  Follow up with me in 5 days.   DISCHARGE MEDICATIONS:  1. Prednisone 20 mg to be tapered over 7 days.  2. Hydroxyzine 25 mg q. 6 hours p.r.n.  3. EpiPen 0.3 mg was to be used as directed.  4. Zyrtec 20 mg daily.  5. Zantac 150 mg b.i.d.  6. Lexapro 10 mg daily.  7. Xanax 0.25 mg b.i.d. p.r.n.  8. TobraDex ointment to the eye, 1/2 inch b.i.d. p.r.n.      Fleet Contras, M.D.  Electronically Signed     EA/MEDQ  D:  03/21/2007  T:  03/21/2007  Job:  [id number]

## 2011-04-08 NOTE — H&P (Signed)
Danielle Bass, Danielle Bass                          ACCOUNT NO.:  [id number]   MEDICAL RECORD NO.:  [id number]                   PATIENT TYPE:  AMB   LOCATION:  SDC                                  FACILITY:  [facility]   PHYSICIAN:  Charles A. Sydnee Cabal, MD            DATE OF BIRTH:  [birth year]   DATE OF ADMISSION:  10/23/2003  DATE OF DISCHARGE:                                HISTORY & PHYSICAL   HISTORY OF PRESENT ILLNESS:  A 35 year old, para 4-0-0-4, who presents  requesting permanent sterilization.  She gives informed consent, accepts  risks of infection, bleeding, bowel and bladder damage, ureteral damage,  blood product risk, hepatitis and HIV exposure, possible failure risk of  approximately 1 in 400.  She gives informed consent.  Alternative forms of  contraception have been discussed, and questions were answered.   PAST MEDICAL HISTORY:  None.   SURGICAL HISTORY:  SVD x4.  Otherwise, no surgery.   MEDICATIONS:  None.   ALLERGIES:  No known drug allergies.   SOCIAL HISTORY:  Not sexually active at this time.  She is separated from  her partner.  No tobacco, ethanol, or drug use.   FAMILY HISTORY:  Denies family history of breast, uterus, ovary, cervix,  colon cancer, lymphoma, coronary artery disease, stroke, diabetes,  hypertension.   REVIEW OF SYSTEMS:  Denies fever, chills, rashes, lesions, headaches,  dizziness, chest pain, shortness of breath, wheezing, diarrhea,  constipation, bleeding, melena, hematochezia, urgency, frequency, dysuria,  incontinence, or hematuria.  No galacturia.  No emotional changes.   PHYSICAL EXAMINATION:  GENERAL:  Alert and oriented x3.  In no distress.  VITAL SIGNS:  Blood pressure 118/80, respirations 16, pulse 74, weight 129.5  pounds.  HEENT:  Grossly within normal limits.  NECK:  Supple without thyromegaly or adenopathy.  LUNGS:  Clear bilaterally.  HEART:  Regular rate and rhythm without murmur, rub, or gallop.  BREASTS:  No masses,  tenderness, discharge, skin or nipple changes  bilaterally.  ABDOMEN:  Soft, flat, nontender.  No hepatosplenomegaly or other masses  noted.  No axillary or groin nodes.  PELVIC:  Normal external female genitalia.  Bartholin's cyst, urethral, and  Skene within normal limits.  Urethral meatus normal.  Urethra normal.  Bladder normal.  Vaginal with a small amount of creamy discharge with wet  prep showing some clue cells, basically asymptomatic.  No cervical motion  tenderness to present bimanual examination.  No masses palpable.  Uterus at  4-6 week size, mobile, and nontender.  Ovaries palpated to normal size  bilaterally.  RECTAL:  Not done.   ASSESSMENT:  Undesired fertility.   PLAN:  The patient will be admitted through same day surgery to undergo a  laparoscopic tubal ligation.  NPO past 0600; clear liquids up until that  time, and therefore NPO.  Quantitative beta hCG and CBC will be drawn  preoperatively.  All questions were answered, and will proceed  as outlined.  The surgery is scheduled for October 23, 2003.                                               Charles A. Sydnee Cabal, MD    CAD/MEDQ  D:  10/21/2003  T:  10/21/2003  Job:  [id number]

## 2011-04-08 NOTE — Op Note (Signed)
NAMEDORETHY, TOMEY                          ACCOUNT NO.:  [id number]   MEDICAL RECORD NO.:  [id number]                   PATIENT TYPE:  AMB   LOCATION:  SDC                                  FACILITY:  [facility]   PHYSICIAN:  Charles A. Sydnee Cabal, MD            DATE OF BIRTH:  1976/08/18   DATE OF PROCEDURE:  10/24/2003  DATE OF DISCHARGE:                                 OPERATIVE REPORT   PREOPERATIVE DIAGNOSIS:  Undesired fertility.   POSTOPERATIVE DIAGNOSIS:  Undesired fertility.   PROCEDURE:  Laparoscopic bilateral tubal ligation with Falope rings.   SURGEON:  Charles A. Sydnee Cabal, M.D.   COMPLICATIONS:  None.   OPERATIVE FINDINGS:  Normal pelvis.  Instrument, sponge, and needle count  correct x2.   DESCRIPTION OF PROCEDURE:  The patient was taken to the operating room and  placed in the supine position, and general anesthetic was induced without  difficulty.  Sterile prep and drape was undertaken with the patient in the  dorsal lithotomy position in Allen-type stirrups.  A Cohen cannula was used  on the cervix with a single-tooth tenaculum for manipulation during the  case.  Attention was then turned to the abdomen.  Marcaine 0.25% plain was  injected a total of 5 mL divided up between the umbilicus and suprapubic  port sites.  A 1 cm incision was made at the umbilicus.  A Veress needle was  placed with anterior traction on the abdominal wall.  Re-aspiration, hanging  drop test, and insufflation pressures less than 5 mmHg at 1.5 L of CO2  insufflation per minute all indicated intraperitoneal location.  Adequate  pneumoperitoneum was achieved at 3 L insufflation.  The Veress needle was  removed with anterior traction on the abdominal wall.  A 10 mm port was then  placed without difficulty and verification of intraperitoneal location was  verified by immediate placement of the scope.  There was no damage to bowel,  bladder, or vascular structures.  Under direct visualization the  Falope ring  applicator was placed though a separate stab incision two fingerbreadths  above the symphysis pubis in the midline.  Falope rings were placed  approximately 2.5 cm from the uterine cornua on each side without  difficulty, a knuckle of tube pulled up nicely through each ring, blanched,  and hemostasis was adequate.  Desufflation was allowed to occur.  Peritoneal  edges were visualized under low pressure.  Hemostasis was good.  Instruments  were removed.  Monocryl 3-0 was then used in subcuticular stitch at the  umbilicus and suprapubic ports were closed at the skin.  Sterile Band-Aids  were applied.  Vaginal instruments were removed.  Hemostasis was excellent.  The patient was awakened and extubated by the anesthetist and taken to  recovery with the physician in attendance, having tolerated the procedure  well.  Charles A. Sydnee Cabal, MD    CAD/MEDQ  D:  10/23/2003  T:  10/24/2003  Job:  [id number]

## 2011-06-04 ENCOUNTER — Emergency Department (HOSPITAL_COMMUNITY)
Admission: EM | Admit: 2011-06-04 | Discharge: 2011-06-05 | Disposition: A | Discharge status: Home / Self Care | Payer: Self-pay | Attending: Emergency Medicine | Admitting: Emergency Medicine

## 2011-06-04 DIAGNOSIS — L851 Acquired keratosis [keratoderma] palmaris et plantaris: Secondary | ICD-10-CM | POA: Insufficient documentation

## 2011-06-04 DIAGNOSIS — T7840XA Allergy, unspecified, initial encounter: Secondary | ICD-10-CM | POA: Insufficient documentation

## 2011-06-04 DIAGNOSIS — L500 Allergic urticaria: Secondary | ICD-10-CM | POA: Insufficient documentation

## 2011-06-04 DIAGNOSIS — X58XXXA Exposure to other specified factors, initial encounter: Secondary | ICD-10-CM | POA: Insufficient documentation

## 2011-06-04 DIAGNOSIS — L259 Unspecified contact dermatitis, unspecified cause: Secondary | ICD-10-CM | POA: Insufficient documentation

## 2011-07-25 ENCOUNTER — Emergency Department (HOSPITAL_COMMUNITY)
Admission: EM | Admit: 2011-07-25 | Discharge: 2011-07-25 | Disposition: A | Discharge status: Home / Self Care | Payer: Self-pay | Attending: Emergency Medicine | Admitting: Emergency Medicine

## 2011-07-25 DIAGNOSIS — R21 Rash and other nonspecific skin eruption: Secondary | ICD-10-CM | POA: Insufficient documentation

## 2011-07-25 DIAGNOSIS — R42 Dizziness and giddiness: Secondary | ICD-10-CM | POA: Insufficient documentation

## 2011-07-25 DIAGNOSIS — L259 Unspecified contact dermatitis, unspecified cause: Secondary | ICD-10-CM | POA: Insufficient documentation

## 2011-07-25 LAB — POCT I-STAT, CHEM 8
BUN: 7 mg/dL (ref 6–23)
HCT: 39 % (ref 36.0–46.0)
Hemoglobin: 13.3 g/dL (ref 12.0–15.0)
Sodium: 143 mEq/L (ref 135–145)
TCO2: 25 mmol/L (ref 0–100)

## 2011-08-02 ENCOUNTER — Emergency Department (HOSPITAL_COMMUNITY)
Admission: EM | Admit: 2011-08-02 | Discharge: 2011-08-02 | Disposition: A | Discharge status: Home / Self Care | Payer: PRIVATE HEALTH INSURANCE | Attending: Emergency Medicine | Admitting: Emergency Medicine

## 2011-08-02 DIAGNOSIS — H6120 Impacted cerumen, unspecified ear: Secondary | ICD-10-CM | POA: Insufficient documentation

## 2011-08-02 DIAGNOSIS — R21 Rash and other nonspecific skin eruption: Secondary | ICD-10-CM | POA: Insufficient documentation

## 2011-08-02 DIAGNOSIS — L299 Pruritus, unspecified: Secondary | ICD-10-CM | POA: Insufficient documentation

## 2011-08-02 DIAGNOSIS — R42 Dizziness and giddiness: Secondary | ICD-10-CM | POA: Insufficient documentation

## 2011-08-02 LAB — POCT I-STAT, CHEM 8
BUN: 11 mg/dL (ref 6–23)
Calcium, Ion: 1.14 mmol/L (ref 1.12–1.32)
Chloride: 104 meq/L (ref 96–112)
Creatinine, Ser: 0.8 mg/dL (ref 0.50–1.10)
Glucose, Bld: 92 mg/dL (ref 70–99)
HCT: 44 % (ref 36.0–46.0)
Hemoglobin: 15 g/dL (ref 12.0–15.0)
Potassium: 4.3 mEq/L (ref 3.5–5.1)
Sodium: 137 mEq/L (ref 135–145)
TCO2: 24 mmol/L (ref 0–100)

## 2011-08-03 LAB — POCT I-STAT, CHEM 8
BUN: 12 mg/dL (ref 6–23)
Calcium, Ion: 1.13 mmol/L (ref 1.12–1.32)
Chloride: 104 mEq/L (ref 96–112)
Creatinine, Ser: 0.9 mg/dL (ref 0.50–1.10)
Glucose, Bld: 92 mg/dL (ref 70–99)
HCT: 44 % (ref 36.0–46.0)
Hemoglobin: 15 g/dL (ref 12.0–15.0)
Potassium: 4.3 mEq/L (ref 3.5–5.1)
Sodium: 138 mEq/L (ref 135–145)
TCO2: 24 mmol/L (ref 0–100)

## 2011-08-15 ENCOUNTER — Emergency Department (HOSPITAL_COMMUNITY)
Admission: EM | Admit: 2011-08-15 | Discharge: 2011-08-15 | Disposition: A | Discharge status: Home / Self Care | Payer: PRIVATE HEALTH INSURANCE | Attending: Emergency Medicine | Admitting: Emergency Medicine

## 2011-08-15 DIAGNOSIS — X58XXXA Exposure to other specified factors, initial encounter: Secondary | ICD-10-CM | POA: Insufficient documentation

## 2011-08-15 DIAGNOSIS — T7840XA Allergy, unspecified, initial encounter: Secondary | ICD-10-CM | POA: Insufficient documentation

## 2011-08-15 DIAGNOSIS — R221 Localized swelling, mass and lump, neck: Secondary | ICD-10-CM | POA: Insufficient documentation

## 2011-08-15 DIAGNOSIS — R220 Localized swelling, mass and lump, head: Secondary | ICD-10-CM | POA: Insufficient documentation

## 2011-08-19 LAB — CBC
HCT: 29 — ABNORMAL LOW
HCT: 31.3 — ABNORMAL LOW
Hemoglobin: 11.8 — ABNORMAL LOW
Hemoglobin: 10.2 — ABNORMAL LOW
MCHC: 32.8
MCHC: 33.5
MCHC: 31.9
MCV: 87.2
MCV: 87.3
MCV: 87.4
MCV: 87.6
MCV: 86.8
MCV: 87.1
Platelets: 197
Platelets: 252
Platelets: 343
RBC: 3.31 — ABNORMAL LOW
RBC: 3.51 — ABNORMAL LOW
RBC: 3.58 — ABNORMAL LOW
RBC: 4.07
RBC: 3.67 — ABNORMAL LOW
RDW: 12.9
RDW: 13.6
WBC: 10.3
WBC: 10.9 — ABNORMAL HIGH
WBC: 9.4
WBC: 11 — ABNORMAL HIGH

## 2011-08-19 LAB — BASIC METABOLIC PANEL
BUN: 2 — ABNORMAL LOW
CO2: 28
CO2: 29
CO2: 27
Calcium: 8.4
Chloride: 103
Chloride: 103
Chloride: 104
Chloride: 104
Creatinine, Ser: 0.66
Creatinine, Ser: 0.7
Creatinine, Ser: 0.81
Creatinine, Ser: 0.8
GFR calc Af Amer: >60
GFR calc Af Amer: >60
GFR calc Af Amer: >60
GFR calc Af Amer: >60
GFR calc non Af Amer: >60
Glucose, Bld: 102 — ABNORMAL HIGH
Potassium: 3.7
Potassium: 4.2
Potassium: 3.5
Sodium: 140

## 2011-08-19 LAB — DIFFERENTIAL
Basophils Relative: 0
Eosinophils Absolute: 0.5
Eosinophils Absolute: 1.7 — ABNORMAL HIGH
Lymphs Abs: 1.2
Monocytes Absolute: 0.6
Monocytes Absolute: 0.5
Monocytes Relative: 5
Monocytes Relative: 5
Neutrophils Relative %: 79 — ABNORMAL HIGH

## 2011-08-19 LAB — POCT I-STAT, CHEM 8
Calcium, Ion: 1.17
Glucose, Bld: 88
HCT: 34 — ABNORMAL LOW
Hemoglobin: 11.6 — ABNORMAL LOW
TCO2: 28

## 2011-08-23 ENCOUNTER — Other Ambulatory Visit: Payer: Self-pay

## 2011-08-23 ENCOUNTER — Other Ambulatory Visit (HOSPITAL_COMMUNITY)
Admission: RE | Admit: 2011-08-23 | Discharge: 2011-08-23 | Disposition: A | Discharge status: Home / Self Care | Payer: No Typology Code available for payment source | Source: Ambulatory Visit | Attending: Internal Medicine | Admitting: Internal Medicine

## 2011-08-23 DIAGNOSIS — Z01419 Encounter for gynecological examination (general) (routine) without abnormal findings: Secondary | ICD-10-CM | POA: Insufficient documentation

## 2011-10-22 ENCOUNTER — Emergency Department (HOSPITAL_COMMUNITY)
Admission: EM | Admit: 2011-10-22 | Discharge: 2011-10-23 | Disposition: A | Discharge status: Home / Self Care | Payer: Self-pay | Attending: Emergency Medicine | Admitting: Emergency Medicine

## 2011-10-22 ENCOUNTER — Encounter: Payer: Self-pay | Admitting: Adult Health

## 2011-10-22 DIAGNOSIS — R509 Fever, unspecified: Secondary | ICD-10-CM | POA: Insufficient documentation

## 2011-10-22 DIAGNOSIS — J029 Acute pharyngitis, unspecified: Secondary | ICD-10-CM | POA: Insufficient documentation

## 2011-10-22 HISTORY — DX: Bronchitis, not specified as acute or chronic: J40

## 2011-10-22 HISTORY — DX: Dermatitis, unspecified: L30.9

## 2011-10-22 MED ORDER — IBUPROFEN 200 MG PO TABS
600.0000 mg | ORAL_TABLET | Freq: Once | ORAL | Status: AC
  Administered 2011-10-22: 600 mg via ORAL
  Filled 2011-10-22: qty 3

## 2011-10-22 NOTE — ED Notes (Signed)
C/o sore throat for one week with decreased po intake due to pain, c/o dizziness and muscle aches.

## 2011-10-23 NOTE — ED Provider Notes (Signed)
History     CSN: [id number] Arrival date & time: 10/22/2011  9:53 PM   First MD Initiated Contact with Patient 10/23/11 0239      Chief Complaint  Patient presents with  . Fever  . Sore Throat    (Consider location/radiation/quality/duration/timing/severity/associated sxs/prior treatment) HPI The patient presents with a sore throat. She notes that her symptoms began gradually approximately one week ago. Since onset her symptoms worsened, and aside from a transient episode of improvement 2 days ago, she has had nearly constant discomfort. She notes a sore throat, generalized discomfort, myalgias, anorexia, chills. She denies any fever, disorientation, chest pain, dyspnea, cough. She notes mild improvement with OTC medications. Symptoms seem to be worsening on her own. Past Medical History  Diagnosis Date  . Eczema   . Asthma   . Bronchitis     History reviewed. No pertinent past surgical history.  History reviewed. No pertinent family history.  History  Substance Use Topics  . Smoking status: Never Smoker   . Smokeless tobacco: Not on file  . Alcohol Use: No    OB History    Grav Para Term Preterm Abortions TAB SAB Ect Mult Living                  Review of Systems  All other systems reviewed and are negative.    Allergies  Review of patient's allergies indicates no known allergies.  Home Medications   Current Outpatient Rx  Name Route Sig Dispense Refill  . DIPHENHYDRAMINE HCL 25 MG PO TABS Oral Take 50 mg by mouth every 6 (six) hours as needed. Allergies     . RANITIDINE HCL 150 MG PO TABS Oral Take 150 mg by mouth 2 (two) times daily. Itching     . SERTRALINE HCL 100 MG PO TABS Oral Take 100 mg by mouth daily.      Marland Kitchen ZOLPIDEM TARTRATE 10 MG PO TABS Oral Take 10 mg by mouth at bedtime as needed. Sleep       BP 128/84  Pulse 83  Temp(Src) 98.4 F (36.9 C) (Oral)  Resp 20  SpO2 100%  Physical Exam  Nursing note and vitals reviewed. Constitutional:  She is oriented to person, place, and time. She appears well-developed and well-nourished.  HENT:  Head: Normocephalic and atraumatic.  Eyes: EOM are normal.  Cardiovascular: Normal rate and regular rhythm.   Pulmonary/Chest: Effort normal and breath sounds normal.  Abdominal: She exhibits no distension.  Musculoskeletal: She exhibits no edema and no tenderness.  Neurological: She is alert and oriented to person, place, and time.  Skin: Skin is warm and dry.    ED Course  Procedures (including critical care time)   Labs Reviewed  RAPID STREP SCREEN   No results found.   1. Pharyngitis       MDM  This well-appearing female now presents with 1 week of sore throat and generalized discomfort. On exam the patient is in no distress with vital signs are essentially normal. The patient's strep test was negative. Given the absence of distress, and absence of notable physical exam findings, the otherwise benign health status of the patient is presentation is most consistent with a viral pharyngitis. The patient was discharged with care instructions and return precautions.        Gerhard Munch, MD 10/23/11 [phone]

## 2011-10-23 NOTE — ED Notes (Signed)
MD at bedside. 

## 2011-10-28 ENCOUNTER — Ambulatory Visit
Admission: RE | Admit: 2011-10-28 | Discharge: 2011-10-28 | Disposition: A | Discharge status: Home / Self Care | Payer: No Typology Code available for payment source | Source: Ambulatory Visit | Attending: Allergy and Immunology | Admitting: Allergy and Immunology

## 2011-10-28 ENCOUNTER — Other Ambulatory Visit: Payer: Self-pay | Admitting: Allergy and Immunology

## 2011-10-28 DIAGNOSIS — R05 Cough: Secondary | ICD-10-CM

## 2011-12-15 ENCOUNTER — Encounter (HOSPITAL_COMMUNITY): Payer: Self-pay | Admitting: *Deleted

## 2011-12-15 ENCOUNTER — Emergency Department (INDEPENDENT_AMBULATORY_CARE_PROVIDER_SITE_OTHER)
Admission: EM | Admit: 2011-12-15 | Discharge: 2011-12-15 | Disposition: A | Discharge status: Nursing Home (Includes ICF) | Payer: PRIVATE HEALTH INSURANCE | Source: Home / Self Care | Attending: Emergency Medicine | Admitting: Emergency Medicine

## 2011-12-15 ENCOUNTER — Emergency Department (HOSPITAL_COMMUNITY)
Admission: EM | Admit: 2011-12-15 | Discharge: 2011-12-16 | Disposition: A | Discharge status: Home / Self Care | Payer: PRIVATE HEALTH INSURANCE | Attending: Emergency Medicine | Admitting: Emergency Medicine

## 2011-12-15 ENCOUNTER — Encounter (HOSPITAL_COMMUNITY): Payer: Self-pay | Admitting: Emergency Medicine

## 2011-12-15 ENCOUNTER — Emergency Department (INDEPENDENT_AMBULATORY_CARE_PROVIDER_SITE_OTHER): Payer: PRIVATE HEALTH INSURANCE

## 2011-12-15 DIAGNOSIS — M79609 Pain in unspecified limb: Secondary | ICD-10-CM | POA: Insufficient documentation

## 2011-12-15 DIAGNOSIS — I82409 Acute embolism and thrombosis of unspecified deep veins of unspecified lower extremity: Secondary | ICD-10-CM

## 2011-12-15 DIAGNOSIS — R079 Chest pain, unspecified: Secondary | ICD-10-CM | POA: Insufficient documentation

## 2011-12-15 DIAGNOSIS — M7989 Other specified soft tissue disorders: Secondary | ICD-10-CM | POA: Insufficient documentation

## 2011-12-15 DIAGNOSIS — J45909 Unspecified asthma, uncomplicated: Secondary | ICD-10-CM | POA: Insufficient documentation

## 2011-12-15 LAB — POCT I-STAT, CHEM 8
Chloride: 102 mEq/L (ref 96–112)
Glucose, Bld: 90 mg/dL (ref 70–99)
HCT: 42 % (ref 36.0–46.0)
Potassium: 4.3 mEq/L (ref 3.5–5.1)

## 2011-12-15 LAB — D-DIMER, QUANTITATIVE: D-Dimer, Quant: 0.77 ug/mL-FEU — ABNORMAL HIGH (ref 0.00–0.48)

## 2011-12-15 LAB — POCT URINALYSIS DIP (DEVICE)
Glucose, UA: NEGATIVE mg/dL
Ketones, ur: NEGATIVE mg/dL
Specific Gravity, Urine: 1.015 (ref 1.005–1.030)

## 2011-12-15 MED ORDER — HYDROCODONE-ACETAMINOPHEN 5-325 MG PO TABS
ORAL_TABLET | ORAL | Status: AC
  Filled 2011-12-15: qty 1

## 2011-12-15 MED ORDER — HYDROCODONE-ACETAMINOPHEN 5-325 MG PO TABS
1.0000 | ORAL_TABLET | Freq: Once | ORAL | Status: AC
  Administered 2011-12-15: 1 via ORAL

## 2011-12-15 NOTE — ED Notes (Signed)
PT. REPORTS PROGRESSING RIGHT LEG PAIN WITH SWELLING ONSET LAST Saturday , SEEN AT [facility] THIS EVENING SENT HERE FOR POSSIBLE " BLOOD CLOT" , X-RAY AND BLOOD WORK DONE AT URGENT CARE . DENIES INJURY OR FALL.

## 2011-12-15 NOTE — ED Provider Notes (Signed)
History     CSN: [id number]  Arrival date & time 12/15/11  1716   First MD Initiated Contact with Patient 12/15/11 1730      Chief Complaint  Patient presents with  . Leg Swelling    (Consider location/radiation/quality/duration/timing/severity/associated sxs/prior treatment) HPI Comments: The patient comes in tonight with a variety of complaints, but her major complaint appears to be pain in the right calf, popliteal fossa, posterior thigh which has been going on for the past 6 days and she denies any injury to the area. She has swelling of the entire body including the leg. She states the leg is painful to walk, to drive, or first thing in the morning.  The patient states she fractured her left tibia and 2009 this was repaired by Dr. Dion Saucier and she's had pain in her left leg going on since that. She describes a weakness in both legs and overall general body weakness. She's had swelling going on for about 4 years involving her entire body, face, hands, abdomen, and legs. No cause for this was ever found for it. The swelling tends to come and go. She has a history of asthma and allergies. She sees Dr. Sharyn Lull. She's been on antibiotics since before Thanksgiving for recurrent skin infections. Right now she is on clindamycin. She sees Dr. Jillyn Hidden for anxiety and insomnia and Dr. Sharyn Lull for allergies. She also has a number of other complaints including chest pain, abdominal pain, vomiting, and frequent urination. Her last menstrual period was less than a week ago. The patient states she's not sexually active.   Past Medical History  Diagnosis Date  . Eczema   . Asthma   . Bronchitis     History reviewed. No pertinent past surgical history.  History reviewed. No pertinent family history.  History  Substance Use Topics  . Smoking status: Never Smoker   . Smokeless tobacco: Not on file  . Alcohol Use: No    OB History    Grav Para Term Preterm Abortions TAB SAB Ect Mult Living          Review of Systems  Constitutional: Positive for fatigue. Negative for fever, chills and unexpected weight change.  HENT: Negative for ear pain, congestion, sore throat and rhinorrhea.   Eyes: Negative for redness and visual disturbance.  Respiratory: Negative for cough, shortness of breath and wheezing.   Cardiovascular: Positive for chest pain. Negative for palpitations.  Gastrointestinal: Negative for nausea, vomiting, abdominal pain, diarrhea and constipation.  Genitourinary: Negative for dysuria, urgency and frequency.  Musculoskeletal: Positive for joint swelling and arthralgias.  Skin: Positive for color change and rash.    Allergies  Review of patient's allergies indicates no known allergies.  Home Medications   Current Outpatient Rx  Name Route Sig Dispense Refill  . CLINDAMYCIN HCL 150 MG PO CAPS Oral Take 150 mg by mouth 3 (three) times daily.    . IPRATROPIUM-ALBUTEROL 0.5-2.5 (3) MG/3ML IN SOLN Nebulization Take 3 mLs by nebulization.    Marland Kitchen PREDNISONE 10 MG PO TABS Oral Take 10 mg by mouth daily.    Marland Kitchen DIPHENHYDRAMINE HCL 25 MG PO TABS Oral Take 50 mg by mouth every 6 (six) hours as needed. Allergies     . RANITIDINE HCL 150 MG PO TABS Oral Take 150 mg by mouth 2 (two) times daily. Itching     . SERTRALINE HCL 100 MG PO TABS Oral Take 100 mg by mouth daily.      Marland Kitchen ZOLPIDEM TARTRATE 10 MG  PO TABS Oral Take 10 mg by mouth at bedtime as needed. Sleep       BP 113/67  Pulse 78  Temp(Src) 97.9 F (36.6 C) (Oral)  Resp 24  SpO2 100%  Physical Exam  Nursing note and vitals reviewed. Constitutional: She is oriented to person, place, and time. She appears well-developed and well-nourished. No distress.  HENT:  Head: Normocephalic and atraumatic.  Mouth/Throat: Oropharynx is clear and moist.  Eyes: Conjunctivae and EOM are normal. Pupils are equal, round, and reactive to light. No scleral icterus.  Neck: Normal range of motion. Neck supple.  Cardiovascular:  Normal rate, regular rhythm and normal heart sounds.  Exam reveals no gallop and no friction rub.   No murmur heard. Pulmonary/Chest: Effort normal and breath sounds normal. No respiratory distress. She has no wheezes. She has no rales.  Abdominal: Soft. Bowel sounds are normal. She exhibits no distension and no mass. There is no tenderness. There is no rebound and no guarding.  Musculoskeletal: She exhibits tenderness. She exhibits no edema.       Exam of the right leg reveals generalized tenderness to palpation around the knee including into the anterior and posterior thigh, popliteal fossa, down to the calf with a positive Homans sign. There was no swelling, redness, or heat, and she had no palpable cord. The left leg was also diffusely tender to palpation as well.  Lymphadenopathy:    She has no cervical adenopathy.  Neurological: She is alert and oriented to person, place, and time.  Skin: Skin is warm and dry. Rash noted. She is not diaphoretic.       She has vitiligo involving her right arm in a generalized eczematous rash involving her entire skin.    ED Course  Procedures (including critical care time)  Results for orders placed during the hospital encounter of 12/15/11  D-DIMER, QUANTITATIVE      Component Value Range   D-Dimer, Quant 0.77 (*) 0.00 - 0.48 (ug/mL-FEU)  POCT I-STAT, CHEM 8      Component Value Range   Sodium 140  135 - 145 (mEq/L)   Potassium 4.3  3.5 - 5.1 (mEq/L)   Chloride 102  96 - 112 (mEq/L)   BUN 13  6 - 23 (mg/dL)   Creatinine, Ser 1.61  0.50 - 1.10 (mg/dL)   Glucose, Bld 90  70 - 99 (mg/dL)   Calcium, Ion 0.96  0.45 - 1.32 (mmol/L)   TCO2 28  0 - 100 (mmol/L)   Hemoglobin 14.3  12.0 - 15.0 (g/dL)   HCT 40.9  81.1 - 91.4 (%)  POCT URINALYSIS DIP (DEVICE)      Component Value Range   Glucose, UA NEGATIVE  NEGATIVE (mg/dL)   Bilirubin Urine NEGATIVE  NEGATIVE    Ketones, ur NEGATIVE  NEGATIVE (mg/dL)   Specific Gravity, Urine 1.015  1.005 - 1.030      Hgb urine dipstick TRACE (*) NEGATIVE    pH 7.5  5.0 - 8.0    Protein, ur NEGATIVE  NEGATIVE (mg/dL)   Urobilinogen, UA 0.2  0.0 - 1.0 (mg/dL)   Nitrite NEGATIVE  NEGATIVE    Leukocytes, UA NEGATIVE  NEGATIVE      Labs Reviewed  D-DIMER, QUANTITATIVE - Abnormal; Notable for the following:    D-Dimer, Quant 0.77 (*)    All other components within normal limits  POCT URINALYSIS DIP (DEVICE) - Abnormal; Notable for the following:    Hgb urine dipstick TRACE (*)  All other components within normal limits  POCT I-STAT, CHEM 8  I-STAT, CHEM 8  POCT URINALYSIS DIPSTICK   Dg Knee 2 Views Right  12/15/2011  *RADIOLOGY REPORT*  Clinical Data: Pain  RIGHT KNEE - 1-2 VIEW  Comparison: 09/07/2007  Findings: BB projects in the soft tissues posterior to the tibial plateau.  Negative for fracture, dislocation, or other acute bony abnormality.  No effusion.  IMPRESSION:  1.  Negative for bony abnormality. 2.  BB in the soft tissues posterior to the tibial plateau.  Original Report Authenticated By: Thora Lance III, M.D.     1. DVT, lower extremity       MDM  She has a number of complaints but he appears to have a DVT involving her right leg. She will be sent to the emergency room by shuttle.        Roque Lias, MD 12/15/11 2129

## 2011-12-15 NOTE — ED Notes (Signed)
Pt  Reports  Pain  Both  Legs   As  Well  As generalised  Body  Swelling  She  Reports  The  Pain  For  sev  Days   She  Reports  The  Swelling  For  A  While  She  States she  Is  Being  tx  For  Some  Type of  Skin infection  With  Clindamycin     By  Her  pcp

## 2011-12-15 NOTE — ED Provider Notes (Signed)
History     CSN: [id number]  Arrival date & time 12/15/11  2136   First MD Initiated Contact with Patient 12/15/11 2221      Chief Complaint  Patient presents with  . Leg Pain     Patient is a 36 y.o. female presenting with leg pain.  Leg Pain    history provided by the patient recent urgent care medical record. Patient is a 36 year old female who was sent from the urgent care Center for further evaluation of complaints of right lower extremity pain. Patient reports having waxing and waning pains in the right lower leg subjective symptoms of swelling for the past 6 days. Today patient reports pain is mostly around the knee area. She reports over the last several days pain was increased with using her foot on the gas and brake pedals. She had lab testing and x-rays performed at the urgent care Center. Labs were significant for mildly elevated d-dimer. X-rays were normal. There was concern for possible DVT and patient sent for further evaluation. Patient also reported having symptoms of chest pain that were primarily bothering her yesterday. Symptoms were worse with deep breathing respirations. Patient denies any cough, hemoptysis, shortness of breath or near syncope. Patient denies having similar symptoms previously.    Past Medical History  Diagnosis Date  . Eczema   . Asthma   . Bronchitis     History reviewed. No pertinent past surgical history.  No family history on file.  History  Substance Use Topics  . Smoking status: Never Smoker   . Smokeless tobacco: Not on file  . Alcohol Use: No    OB History    Grav Para Term Preterm Abortions TAB SAB Ect Mult Living                  Review of Systems  Constitutional: Negative for fever and chills.  Respiratory: Negative for cough and shortness of breath.   Cardiovascular: Positive for chest pain. Negative for palpitations and leg swelling.  Gastrointestinal: Negative for abdominal pain.  All other systems reviewed and  are negative.    Allergies  Review of patient's allergies indicates no known allergies.  Home Medications   Current Outpatient Rx  Name Route Sig Dispense Refill  . CLINDAMYCIN HCL 150 MG PO CAPS Oral Take 150 mg by mouth 3 (three) times daily. Began regimen on 12/02/11 & will complete on 12/16/11    . DIPHENHYDRAMINE HCL 25 MG PO TABS Oral Take 50 mg by mouth every 6 (six) hours as needed. Allergies    . IPRATROPIUM-ALBUTEROL 0.5-2.5 (3) MG/3ML IN SOLN Nebulization Take 3 mLs by nebulization 2 (two) times daily.     Marland Kitchen PREDNISONE 10 MG PO TABS Oral Take 10 mg by mouth daily.    Marland Kitchen RANITIDINE HCL 150 MG PO TABS Oral Take 150 mg by mouth 2 (two) times daily.     Marland Kitchen TEMAZEPAM 15 MG PO CAPS Oral Take 15-30 mg by mouth at bedtime.      BP 118/71  Pulse 72  Temp(Src) 97.5 F (36.4 C) (Oral)  Resp 18  SpO2 100%  Physical Exam  Nursing note and vitals reviewed. Constitutional: She is oriented to person, place, and time. She appears well-developed and well-nourished. No distress.  HENT:  Head: Normocephalic and atraumatic.  Cardiovascular: Normal rate and regular rhythm.   Pulmonary/Chest: Effort normal and breath sounds normal. No respiratory distress. She has no wheezes. She has no rales. She exhibits no tenderness.  Abdominal:  Soft. There is no tenderness.  Musculoskeletal: Normal range of motion. She exhibits no edema.       Full range of motion. Mild tenderness to palpation along the medial anterior aspect of right knee. No asymmetry of her lower extremities. No tenderness over calves. Normal dorsal pedal pulses, distal sensations and movements in toes and feet.  Neurological: She is alert and oriented to person, place, and time.  Skin: Skin is warm and dry. No rash noted.  Psychiatric: She has a normal mood and affect. Her behavior is normal.    ED Course  Procedures   Labs Reviewed - No data to display  Results for orders placed during the hospital encounter of 12/15/11    D-DIMER, QUANTITATIVE      Component Value Range   D-Dimer, Quant 0.77 (*) 0.00 - 0.48 (ug/mL-FEU)  POCT I-STAT, CHEM 8      Component Value Range   Sodium 140  135 - 145 (mEq/L)   Potassium 4.3  3.5 - 5.1 (mEq/L)   Chloride 102  96 - 112 (mEq/L)   BUN 13  6 - 23 (mg/dL)   Creatinine, Ser 1.61  0.50 - 1.10 (mg/dL)   Glucose, Bld 90  70 - 99 (mg/dL)   Calcium, Ion 0.96  0.45 - 1.32 (mmol/L)   TCO2 28  0 - 100 (mmol/L)   Hemoglobin 14.3  12.0 - 15.0 (g/dL)   HCT 40.9  81.1 - 91.4 (%)  POCT URINALYSIS DIP (DEVICE)      Component Value Range   Glucose, UA NEGATIVE  NEGATIVE (mg/dL)   Bilirubin Urine NEGATIVE  NEGATIVE    Ketones, ur NEGATIVE  NEGATIVE (mg/dL)   Specific Gravity, Urine 1.015  1.005 - 1.030    Hgb urine dipstick TRACE (*) NEGATIVE    pH 7.5  5.0 - 8.0    Protein, ur NEGATIVE  NEGATIVE (mg/dL)   Urobilinogen, UA 0.2  0.0 - 1.0 (mg/dL)   Nitrite NEGATIVE  NEGATIVE    Leukocytes, UA NEGATIVE  NEGATIVE      Dg Knee 2 Views Right  12/15/2011  *RADIOLOGY REPORT*  Clinical Data: Pain  RIGHT KNEE - 1-2 VIEW  Comparison: 09/07/2007  Findings: BB projects in the soft tissues posterior to the tibial plateau.  Negative for fracture, dislocation, or other acute bony abnormality.  No effusion.  IMPRESSION:  1.  Negative for bony abnormality. 2.  BB in the soft tissues posterior to the tibial plateau.  Original Report Authenticated By: Thora Lance III, M.D.     1. DVT (deep venous thrombosis)       MDM  10:45 PM patient seen and evaluated. Patient in no acute distress. Pt with normal respirations, unlabored breathing, normal O2 saturation, normal heart rate.  Patient sent from urgent care Center for further evaluation. Patient with elevated d-dimer. Patient does report having symptoms of chest pain yesterday but currently pain-free. At this time will order CT scan show rule out unlikely possibility for PE. If negative plan to schedule patient for outpatient venous  Doppler of right lower leg and provide dose of Lovenox in the emergency room.      Angus Seller, [place] 12/16/11 [phone]

## 2011-12-16 ENCOUNTER — Emergency Department (HOSPITAL_COMMUNITY): Payer: PRIVATE HEALTH INSURANCE

## 2011-12-16 ENCOUNTER — Inpatient Hospital Stay (HOSPITAL_COMMUNITY): Admission: RE | Admit: 2011-12-16 | Payer: PRIVATE HEALTH INSURANCE | Source: Ambulatory Visit

## 2011-12-16 ENCOUNTER — Encounter (HOSPITAL_COMMUNITY): Payer: Self-pay | Admitting: Radiology

## 2011-12-16 ENCOUNTER — Ambulatory Visit (HOSPITAL_COMMUNITY)
Admission: RE | Admit: 2011-12-16 | Discharge: 2011-12-16 | Disposition: A | Discharge status: Home / Self Care | Payer: PRIVATE HEALTH INSURANCE | Source: Ambulatory Visit | Attending: Emergency Medicine | Admitting: Emergency Medicine

## 2011-12-16 DIAGNOSIS — R52 Pain, unspecified: Secondary | ICD-10-CM

## 2011-12-16 DIAGNOSIS — M79609 Pain in unspecified limb: Secondary | ICD-10-CM | POA: Insufficient documentation

## 2011-12-16 MED ORDER — IOHEXOL 350 MG/ML SOLN
100.0000 mL | Freq: Once | INTRAVENOUS | Status: AC | PRN
  Administered 2011-12-16: 100 mL via INTRAVENOUS

## 2011-12-16 MED ORDER — HYDROCODONE-ACETAMINOPHEN 5-325 MG PO TABS: 2.0000 | ORAL_TABLET | ORAL | Status: AC | PRN

## 2011-12-16 MED ORDER — ENOXAPARIN SODIUM 60 MG/0.6ML ~~LOC~~ SOLN
1.0000 mg/kg | SUBCUTANEOUS | Status: AC
  Administered 2011-12-16: 60 mg via SUBCUTANEOUS
  Filled 2011-12-16: qty 0.6

## 2011-12-16 NOTE — Progress Notes (Signed)
*  PRELIMINARY RESULTS* Vascular Ultrasound Right Lower Extremity Venous Duplex has been completed.  Preliminary findings: Right = No evidence of DVT or baker's cyst.  Farrel Demark RDMS 12/16/2011, 1:23 PM

## 2011-12-17 NOTE — ED Provider Notes (Signed)
Medical screening examination/treatment/procedure(s) were performed by non-physician practitioner and as supervising physician I was immediately available for consultation/collaboration.  Alexee Delsanto, MD 12/17/11 0712 

## 2012-06-11 ENCOUNTER — Emergency Department (HOSPITAL_COMMUNITY)
Admission: EM | Admit: 2012-06-11 | Discharge: 2012-06-11 | Discharge status: Against Medical Advice | Payer: PRIVATE HEALTH INSURANCE | Attending: Emergency Medicine | Admitting: Emergency Medicine

## 2012-06-11 ENCOUNTER — Encounter (HOSPITAL_COMMUNITY): Payer: Self-pay | Admitting: *Deleted

## 2012-06-11 DIAGNOSIS — G43909 Migraine, unspecified, not intractable, without status migrainosus: Secondary | ICD-10-CM | POA: Insufficient documentation

## 2012-06-11 NOTE — ED Notes (Signed)
Pt c/o migraine since noon today, photophobia, and nausea with movement.  Pain in frontal lobes.   Denies CP, SOB, vomiting.

## 2012-12-29 ENCOUNTER — Emergency Department (HOSPITAL_COMMUNITY)
Admission: EM | Admit: 2012-12-29 | Discharge: 2012-12-29 | Disposition: A | Discharge status: Home / Self Care | Payer: Medicaid Other | Attending: Emergency Medicine | Admitting: Emergency Medicine

## 2012-12-29 ENCOUNTER — Emergency Department (HOSPITAL_COMMUNITY): Payer: Medicaid Other

## 2012-12-29 ENCOUNTER — Encounter (HOSPITAL_COMMUNITY): Payer: Self-pay | Admitting: Emergency Medicine

## 2012-12-29 DIAGNOSIS — R112 Nausea with vomiting, unspecified: Secondary | ICD-10-CM | POA: Insufficient documentation

## 2012-12-29 DIAGNOSIS — R109 Unspecified abdominal pain: Secondary | ICD-10-CM | POA: Insufficient documentation

## 2012-12-29 DIAGNOSIS — Z79899 Other long term (current) drug therapy: Secondary | ICD-10-CM | POA: Insufficient documentation

## 2012-12-29 DIAGNOSIS — Z8739 Personal history of other diseases of the musculoskeletal system and connective tissue: Secondary | ICD-10-CM | POA: Insufficient documentation

## 2012-12-29 DIAGNOSIS — Z3202 Encounter for pregnancy test, result negative: Secondary | ICD-10-CM | POA: Insufficient documentation

## 2012-12-29 DIAGNOSIS — Z872 Personal history of diseases of the skin and subcutaneous tissue: Secondary | ICD-10-CM | POA: Insufficient documentation

## 2012-12-29 DIAGNOSIS — M549 Dorsalgia, unspecified: Secondary | ICD-10-CM | POA: Insufficient documentation

## 2012-12-29 DIAGNOSIS — J45909 Unspecified asthma, uncomplicated: Secondary | ICD-10-CM | POA: Insufficient documentation

## 2012-12-29 HISTORY — DX: Other chronic pain: G89.29

## 2012-12-29 HISTORY — DX: Dorsalgia, unspecified: M54.9

## 2012-12-29 LAB — URINALYSIS, ROUTINE W REFLEX MICROSCOPIC
Ketones, ur: NEGATIVE mg/dL
Protein, ur: NEGATIVE mg/dL
Urobilinogen, UA: 1 mg/dL (ref 0.0–1.0)

## 2012-12-29 LAB — CBC WITH DIFFERENTIAL/PLATELET
Eosinophils Relative: 1 % (ref 0–5)
HCT: 38.7 % (ref 36.0–46.0)
Lymphocytes Relative: 9 % — ABNORMAL LOW (ref 12–46)
Lymphs Abs: 1.1 10*3/uL (ref 0.7–4.0)
MCV: 86.2 fL (ref 78.0–100.0)
Monocytes Absolute: 0.6 10*3/uL (ref 0.1–1.0)
RBC: 4.49 MIL/uL (ref 3.87–5.11)
WBC: 12.6 10*3/uL — ABNORMAL HIGH (ref 4.0–10.5)

## 2012-12-29 LAB — COMPREHENSIVE METABOLIC PANEL
ALT: 5 U/L (ref 0–35)
CO2: 22 mEq/L (ref 19–32)
Calcium: 9.7 mg/dL (ref 8.4–10.5)
Creatinine, Ser: 0.84 mg/dL (ref 0.50–1.10)
GFR calc Af Amer: >90 mL/min (ref >90)
GFR calc non Af Amer: 88 mL/min — ABNORMAL LOW (ref >90)
Glucose, Bld: 82 mg/dL (ref 70–99)

## 2012-12-29 LAB — URINE MICROSCOPIC-ADD ON

## 2012-12-29 LAB — PREGNANCY, URINE: Preg Test, Ur: NEGATIVE

## 2012-12-29 MED ORDER — ONDANSETRON HCL 4 MG PO TABS: 8.0000 mg | ORAL_TABLET | Freq: Three times a day (TID) | ORAL | Status: DC | PRN

## 2012-12-29 MED ORDER — ONDANSETRON HCL 4 MG/2ML IJ SOLN
4.0000 mg | Freq: Once | INTRAMUSCULAR | Status: AC
  Administered 2012-12-29: 4 mg via INTRAVENOUS

## 2012-12-29 MED ORDER — IOHEXOL 300 MG/ML  SOLN
50.0000 mL | Freq: Once | INTRAMUSCULAR | Status: AC | PRN
  Administered 2012-12-29: 50 mL via ORAL

## 2012-12-29 MED ORDER — IOHEXOL 300 MG/ML  SOLN
100.0000 mL | Freq: Once | INTRAMUSCULAR | Status: AC | PRN
  Administered 2012-12-29: 100 mL via INTRAVENOUS

## 2012-12-29 MED ORDER — HYDROMORPHONE HCL PF 1 MG/ML IJ SOLN
1.0000 mg | Freq: Once | INTRAMUSCULAR | Status: AC
  Administered 2012-12-29: 1 mg via INTRAVENOUS
  Filled 2012-12-29: qty 1

## 2012-12-29 MED ORDER — SODIUM CHLORIDE 0.9 % IV SOLN
INTRAVENOUS | Status: DC
  Administered 2012-12-29: 09:00:00 via INTRAVENOUS

## 2012-12-29 MED ORDER — ONDANSETRON HCL 4 MG/2ML IJ SOLN
INTRAMUSCULAR | Status: AC
  Filled 2012-12-29: qty 2

## 2012-12-29 MED ORDER — ONDANSETRON HCL 4 MG/2ML IJ SOLN
4.0000 mg | Freq: Once | INTRAMUSCULAR | Status: AC
  Administered 2012-12-29: 4 mg via INTRAVENOUS
  Filled 2012-12-29: qty 2

## 2012-12-29 MED ORDER — HYDROCODONE-ACETAMINOPHEN 5-325 MG PO TABS: 2.0000 | ORAL_TABLET | Freq: Four times a day (QID) | ORAL | Status: DC | PRN

## 2012-12-29 NOTE — ED Notes (Signed)
VS in normal limits.  Pt has 1/10 pain.  Being discharged with father and daughter.

## 2012-12-29 NOTE — ED Notes (Signed)
Per EMS pt transported from home c/o abd pain onset last evening @ 2100. +n/v. R sided pain radiating to R flank and pelvis.

## 2012-12-29 NOTE — ED Provider Notes (Signed)
History     CSN: [id number]  Arrival date & time 12/29/12  0606   First MD Initiated Contact with Patient 12/29/12 0700      Chief Complaint  Patient presents with  . Abdominal Pain    (Consider location/radiation/quality/duration/timing/severity/associated sxs/prior treatment) Patient is a 37 y.o. female presenting with abdominal pain.  Abdominal Pain Associated symptoms: nausea and vomiting    Complains of right-sided abdominal pain radiating to right flank onset 4:45 AM today. Awakened from sleep. Patient vomited one time since onset of pain last bowel movement yesterday normal last normal menstrual period 2 weeks ago. No vaginal discharge. Pain worse with movement improved with anything she treated herself with hydrocodone and another pain medicine which he does not recall. Pain is severe constant sharp in quality. No other associated symptoms Past Medical History  Diagnosis Date  . Eczema   . Asthma   . Bronchitis   . Chronic back pain     Past Surgical History  Procedure Laterality Date  . Leg surgery      tib fib left?    No family history on file.  History  Substance Use Topics  . Smoking status: Never Smoker   . Smokeless tobacco: Not on file  . Alcohol Use: No    OB History   Grav Para Term Preterm Abortions TAB SAB Ect Mult Living                  Review of Systems  Constitutional: Negative.   HENT: Negative.   Respiratory: Negative.   Cardiovascular: Negative.   Gastrointestinal: Positive for nausea, vomiting and abdominal pain.  Musculoskeletal: Positive for back pain.  Skin: Negative.   Neurological: Negative.   Psychiatric/Behavioral: Negative.   All other systems reviewed and are negative.    Allergies  Aspirin  Home Medications   Current Outpatient Rx  Name  Route  Sig  Dispense  Refill  . FLUoxetine (PROZAC) 20 MG capsule   Oral   Take 20 mg by mouth every morning.           BP 118/60  Pulse 68  Temp(Src) 97.9 F (36.6  C) (Oral)  Resp 16  SpO2 100%  LMP 12/15/2012  Physical Exam  Nursing note and vitals reviewed. Constitutional: She appears well-developed and well-nourished. She appears distressed.  Appears uncomfortable  HENT:  Head: Normocephalic and atraumatic.  Eyes: Conjunctivae are normal. Pupils are equal, round, and reactive to light.  Neck: Neck supple. No tracheal deviation present. No thyromegaly present.  Cardiovascular: Normal rate and regular rhythm.   No murmur heard. Pulmonary/Chest: Effort normal and breath sounds normal.  Abdominal: Bowel sounds are normal. She exhibits no distension and no mass. There is tenderness. There is no rebound and no guarding.  Tender @ ruq and rlq  Genitourinary:  Right flank tenderness  Musculoskeletal: Normal range of motion. She exhibits no edema and no tenderness.  Neurological: She is alert. Coordination normal.  Skin: Skin is warm and dry. No rash noted.  Psychiatric: She has a normal mood and affect.    ED Course  Procedures (including critical care time)  Labs Reviewed  CBC WITH DIFFERENTIAL  COMPREHENSIVE METABOLIC PANEL  LIPASE, BLOOD  URINALYSIS, ROUTINE W REFLEX MICROSCOPIC  PREGNANCY, URINE   No results found.   No diagnosis found. 8:40 AM Pain improved but requesting more pain medication. Nausea has resolved. Additional dilaudid ordered 11 AM patient feels much improved after treatment with intravenous fluids, hydromorphone and Zofran. His  radial home  MDM  Suspected ruptured ovarian cyst on left side not necessarily etiology of patient's pain has pain is predominantly right sided. Urinalysis is contaminated specimen however patient has no urinary complaints. Plan prescription Norco, Zofran, followup Dr.Andy next week Diagnosis nonspecific abdominal pain        Doug Sou, MD 12/29/12 1116

## 2012-12-29 NOTE — ED Provider Notes (Deleted)
History     CSN: [id number]  Arrival date & time 12/29/12  0606   First MD Initiated Contact with Patient 12/29/12 0700      Chief Complaint  Patient presents with  . Abdominal Pain    (Consider location/radiation/quality/duration/timing/severity/associated sxs/prior treatment) HPI  Past Medical History  Diagnosis Date  . Eczema   . Asthma   . Bronchitis   . Chronic back pain     Past Surgical History  Procedure Laterality Date  . Leg surgery      tib fib left?    No family history on file.  History  Substance Use Topics  . Smoking status: Never Smoker   . Smokeless tobacco: Not on file  . Alcohol Use: No    OB History   Grav Para Term Preterm Abortions TAB SAB Ect Mult Living                  Review of Systems  Allergies  Aspirin  Home Medications   Current Outpatient Rx  Name  Route  Sig  Dispense  Refill  . diclofenac (VOLTAREN) 75 MG EC tablet   Oral   Take 75 mg by mouth 2 (two) times daily.         Marland Kitchen FLUoxetine (PROZAC) 20 MG capsule   Oral   Take 20 mg by mouth every morning.         Marland Kitchen HYDROcodone-acetaminophen (NORCO/VICODIN) 5-325 MG per tablet   Oral   Take 0.5-1 tablets by mouth every 6 (six) hours as needed for pain.         . montelukast (SINGULAIR) 10 MG tablet   Oral   Take 10 mg by mouth at bedtime.         . Omalizumab (XOLAIR [place])   Subcutaneous   Inject into the skin every 14 (fourteen) days. Asthma injection gets at Dr Lucie Leather office           BP 113/44  Pulse 72  Temp(Src) 97.9 F (36.6 C) (Oral)  Resp 12  SpO2 97%  LMP 12/15/2012  Physical Exam  ED Course  Procedures (including critical care time)  Labs Reviewed  CBC WITH DIFFERENTIAL - Abnormal; Notable for the following:    WBC 12.6 (*)    Neutrophils Relative 85 (*)    Neutro Abs 10.7 (*)    Lymphocytes Relative 9 (*)    All other components within normal limits  COMPREHENSIVE METABOLIC PANEL - Abnormal; Notable for the following:    GFR  calc non Af Amer 88 (*)    All other components within normal limits  URINALYSIS, ROUTINE W REFLEX MICROSCOPIC - Abnormal; Notable for the following:    APPearance CLOUDY (*)    Hgb urine dipstick LARGE (*)    Leukocytes, UA SMALL (*)    All other components within normal limits  URINE MICROSCOPIC-ADD ON - Abnormal; Notable for the following:    Squamous Epithelial / LPF MANY (*)    Bacteria, UA MANY (*)    All other components within normal limits  URINE CULTURE  LIPASE, BLOOD  PREGNANCY, URINE   Ct Abdomen Pelvis W Contrast  12/29/2012  *RADIOLOGY REPORT*  Clinical Data: Right-sided abdomen and right flank pain, some nausea and vomiting  CT ABDOMEN AND PELVIS WITH CONTRAST  Technique:  Multidetector CT imaging of the abdomen and pelvis was performed following the standard protocol during bolus administration of intravenous contrast.  Contrast: OMNIPAQUE IOHEXOL 300 MG/ML  SOLN  Comparison:  None.  Findings: The lung bases are clear.  The liver enhances with no focal abnormality and no ductal dilatation is seen.  No calcified gallstones are noted.  The pancreas is normal in size and the pancreatic duct is not dilated.  The adrenal glands and spleen are unremarkable.  The stomach is decompressed.  The kidneys enhance with no calculus or mass and no hydronephrosis is seen.  The abdominal aorta is normal in caliber.  No adenopathy is seen.  The uterus is anteverted and unremarkable.  Probable small ovarian follicles are noted and there appears to be a collapsing cyst anteriorly on the left of 2.2 cm with a small amount of free fluid adjacent.  The urinary bladder is decompressed.  No abnormality of the colon is seen.  The appendix is moderately well visualized, and portions of the appendix fill with air, with no evidence of an inflammatory process noted.  The terminal ileum is unremarkable. Mild lumbar curvature convex to the left is noted.  The lumbar vertebrae are in normal alignment with normal  disc spaces.  IMPRESSION:  1.  Probable collapsing left ovarian cyst possibly a corpus luteum cyst, with a small amount of free fluid adjacent. 2.  The appendix is well seen and appears normal, and the terminal ileum is normal.   Original Report Authenticated By: Dwyane Dee, M.D.      No diagnosis found.  11 AM feels much improved after treatment with hydromorphone and Zofran. Is ready to go home. No longer nauseated. MDM  Patient's pain is predominantly on the right. Collapsing left ovarian cyst not likely etiology of her pain Plan recheck urine this week. Urine is a contaminated specimen. Pt exibiting no urinary sx Prescription Norco, and Zofran Followup with Dr.Andy Diagnosis number nonspecific abdominal pain        Doug Sou, MD 12/29/12 1106

## 2012-12-29 NOTE — ED Notes (Signed)
Patient states that she is tired of moving and does not wish to provide a urine sample at this time.

## 2012-12-30 ENCOUNTER — Inpatient Hospital Stay (HOSPITAL_COMMUNITY)
Admission: EM | Admit: 2012-12-30 | Discharge: 2013-01-02 | DRG: 761 | Disposition: A | Discharge status: Home / Self Care | Payer: No Typology Code available for payment source | Attending: Internal Medicine | Admitting: Internal Medicine

## 2012-12-30 ENCOUNTER — Emergency Department (HOSPITAL_COMMUNITY): Payer: No Typology Code available for payment source

## 2012-12-30 ENCOUNTER — Encounter (HOSPITAL_COMMUNITY): Payer: Self-pay | Admitting: Emergency Medicine

## 2012-12-30 DIAGNOSIS — R42 Dizziness and giddiness: Secondary | ICD-10-CM | POA: Diagnosis not present

## 2012-12-30 DIAGNOSIS — Z886 Allergy status to analgesic agent status: Secondary | ICD-10-CM

## 2012-12-30 DIAGNOSIS — D72829 Elevated white blood cell count, unspecified: Secondary | ICD-10-CM | POA: Diagnosis present

## 2012-12-30 DIAGNOSIS — N83209 Unspecified ovarian cyst, unspecified side: Principal | ICD-10-CM | POA: Diagnosis present

## 2012-12-30 DIAGNOSIS — G8929 Other chronic pain: Secondary | ICD-10-CM | POA: Diagnosis present

## 2012-12-30 DIAGNOSIS — N289 Disorder of kidney and ureter, unspecified: Secondary | ICD-10-CM | POA: Diagnosis present

## 2012-12-30 DIAGNOSIS — Z79899 Other long term (current) drug therapy: Secondary | ICD-10-CM

## 2012-12-30 DIAGNOSIS — J45909 Unspecified asthma, uncomplicated: Secondary | ICD-10-CM | POA: Diagnosis present

## 2012-12-30 DIAGNOSIS — L259 Unspecified contact dermatitis, unspecified cause: Secondary | ICD-10-CM | POA: Diagnosis present

## 2012-12-30 DIAGNOSIS — N83202 Unspecified ovarian cyst, left side: Secondary | ICD-10-CM | POA: Diagnosis present

## 2012-12-30 DIAGNOSIS — R112 Nausea with vomiting, unspecified: Secondary | ICD-10-CM | POA: Diagnosis present

## 2012-12-30 DIAGNOSIS — R109 Unspecified abdominal pain: Secondary | ICD-10-CM

## 2012-12-30 DIAGNOSIS — M549 Dorsalgia, unspecified: Secondary | ICD-10-CM | POA: Diagnosis present

## 2012-12-30 DIAGNOSIS — E876 Hypokalemia: Secondary | ICD-10-CM | POA: Diagnosis present

## 2012-12-30 LAB — CBC WITH DIFFERENTIAL/PLATELET
Basophils Absolute: 0 10*3/uL (ref 0.0–0.1)
Basophils Relative: 0 % (ref 0–1)
Eosinophils Absolute: 0.2 10*3/uL (ref 0.0–0.7)
Hemoglobin: 11.9 g/dL — ABNORMAL LOW (ref 12.0–15.0)
MCH: 27.9 pg (ref 26.0–34.0)
MCHC: 32.2 g/dL (ref 30.0–36.0)
Monocytes Relative: 7 % (ref 3–12)
Neutro Abs: 11.7 10*3/uL — ABNORMAL HIGH (ref 1.7–7.7)
Neutrophils Relative %: 82 % — ABNORMAL HIGH (ref 43–77)
Platelets: 234 10*3/uL (ref 150–400)
RDW: 13 % (ref 11.5–15.5)

## 2012-12-30 LAB — COMPREHENSIVE METABOLIC PANEL
ALT: 6 U/L (ref 0–35)
AST: 11 U/L (ref 0–37)
Alkaline Phosphatase: 55 U/L (ref 39–117)
CO2: 23 mEq/L (ref 19–32)
Chloride: 99 mEq/L (ref 96–112)
GFR calc Af Amer: 47 mL/min — ABNORMAL LOW (ref >90)
GFR calc non Af Amer: 40 mL/min — ABNORMAL LOW (ref >90)
Glucose, Bld: 82 mg/dL (ref 70–99)
Potassium: 3.4 mEq/L — ABNORMAL LOW (ref 3.5–5.1)
Sodium: 133 mEq/L — ABNORMAL LOW (ref 135–145)
Total Bilirubin: 0.6 mg/dL (ref 0.3–1.2)

## 2012-12-30 LAB — URINALYSIS, ROUTINE W REFLEX MICROSCOPIC
Glucose, UA: NEGATIVE mg/dL
Ketones, ur: NEGATIVE mg/dL
Protein, ur: NEGATIVE mg/dL
Urobilinogen, UA: 1 mg/dL (ref 0.0–1.0)

## 2012-12-30 LAB — URINE CULTURE

## 2012-12-30 MED ORDER — HYDROMORPHONE HCL PF 1 MG/ML IJ SOLN
1.0000 mg | Freq: Once | INTRAMUSCULAR | Status: AC
  Administered 2012-12-30: 1 mg via INTRAVENOUS
  Filled 2012-12-30: qty 1

## 2012-12-30 MED ORDER — SODIUM CHLORIDE 0.9 % IV BOLUS (SEPSIS)
1000.0000 mL | Freq: Once | INTRAVENOUS | Status: AC
  Administered 2012-12-30: 1000 mL via INTRAVENOUS

## 2012-12-30 MED ORDER — ONDANSETRON HCL 4 MG/2ML IJ SOLN
4.0000 mg | Freq: Once | INTRAMUSCULAR | Status: AC
  Administered 2012-12-30: 4 mg via INTRAVENOUS
  Filled 2012-12-30: qty 2

## 2012-12-30 MED ORDER — SODIUM CHLORIDE 0.9 % IV SOLN
Freq: Once | INTRAVENOUS | Status: AC
  Administered 2012-12-30: 21:00:00 via INTRAVENOUS

## 2012-12-30 MED ORDER — DIPHENHYDRAMINE HCL 50 MG/ML IJ SOLN
25.0000 mg | Freq: Once | INTRAMUSCULAR | Status: AC
  Administered 2012-12-30: 25 mg via INTRAVENOUS
  Filled 2012-12-30: qty 1

## 2012-12-30 MED ORDER — METOCLOPRAMIDE HCL 5 MG/ML IJ SOLN
10.0000 mg | Freq: Once | INTRAMUSCULAR | Status: AC
  Administered 2012-12-30: 10 mg via INTRAVENOUS
  Filled 2012-12-30: qty 2

## 2012-12-30 NOTE — ED Notes (Signed)
Bed:WA07<BR> Expected date:<BR> Expected time:<BR> Means of arrival:<BR> Comments:<BR>

## 2012-12-30 NOTE — ED Notes (Signed)
Pt's pain is in right flank and radiates around to groin. Incidentally U/A from yesterday large amt of blood. Pt is nauseated but no emesis. Pain goes for 8/10 to 10/10.

## 2012-12-30 NOTE — ED Notes (Signed)
Pt reports increased pain when pt moves a certain way however, pt does not want any more pain medication at this time.

## 2012-12-30 NOTE — ED Notes (Signed)
Pt states she was seen yesterday for ruptured ovarian cyst. The pain meds she was given aren't helping and pt also c/o dizzy, faint and nauseated.

## 2012-12-30 NOTE — ED Notes (Signed)
US in the room.

## 2012-12-30 NOTE — ED Provider Notes (Signed)
History     CSN: [id number]  Arrival date & time 12/30/12  1758   First MD Initiated Contact with Patient 12/30/12 1935      Chief Complaint  Patient presents with  . right lower abdominal pain   . ruptured ovarian cyst   . Nausea    (Consider location/radiation/quality/duration/timing/severity/associated sxs/prior treatment) The history is provided by the patient.   37 year old female had onset 2 days ago of left flank pain which resolved. This was only moderately severe. Yesterday, she had severe right flank and mid abdominal pain. She was seen in emergency and had a CT scan and was told that she had a ruptured ovarian cyst. She was sent home with prescription for Norco but she continues to have severe pain and Norco is not helping. There is no radiation of pain. There is nausea but no vomiting. She denies constipation or diarrhea. She denies urinary urgency, frequency, tenesmus. She denies fever, chills, sweats. Pain is sharp and she rates it at 10/10. Last mental period was 2 weeks ago and was normal.  Past Medical History  Diagnosis Date  . Eczema   . Asthma   . Bronchitis   . Chronic back pain     Past Surgical History  Procedure Laterality Date  . Leg surgery      tib fib left?    No family history on file.  History  Substance Use Topics  . Smoking status: Never Smoker   . Smokeless tobacco: Not on file  . Alcohol Use: No    OB History   Grav Para Term Preterm Abortions TAB SAB Ect Mult Living                  Review of Systems  All other systems reviewed and are negative.    Allergies  Aspirin  Home Medications   Current Outpatient Rx  Name  Route  Sig  Dispense  Refill  . diclofenac (VOLTAREN) 75 MG EC tablet   Oral   Take 75 mg by mouth 2 (two) times daily.         Marland Kitchen FLUoxetine (PROZAC) 20 MG capsule   Oral   Take 20 mg by mouth every morning.         Marland Kitchen HYDROcodone-acetaminophen (NORCO/VICODIN) 5-325 MG per tablet   Oral   Take 0.5-1  tablets by mouth every 6 (six) hours as needed for pain.         . montelukast (SINGULAIR) 10 MG tablet   Oral   Take 10 mg by mouth at bedtime.         . Omalizumab (XOLAIR [place])   Subcutaneous   Inject 1 each into the skin every 14 (fourteen) days. Asthma injection gets at Dr Lucie Leather office         . ondansetron (ZOFRAN) 4 MG tablet   Oral   Take 2 tablets (8 mg total) by mouth every 8 (eight) hours as needed for nausea.   16 tablet   0     BP 116/68  Pulse 78  Temp(Src) 98.1 F (36.7 C) (Oral)  Resp 19  SpO2 96%  LMP 12/15/2012  Physical Exam  Nursing note and vitals reviewed.  37 year old female, resting comfortably and in no acute distress. Vital signs are normal. Oxygen saturation is 96%, which is normal. Head is normocephalic and atraumatic. PERRLA, EOMI. Oropharynx is clear. Neck is nontender and supple without adenopathy or JVD. Back is nontender in the midline. There is  moderate right CVA tenderness. Lungs are clear without rales, wheezes, or rhonchi. Chest is nontender. Heart has regular rate and rhythm without murmur. Abdomen is soft, flat, with moderate right mid abdominal tenderness which is worse laterally. There is no rebound or guarding. There are no masses or hepatosplenomegaly and peristalsis is normoactive. Extremities have no cyanosis or edema, full range of motion is present. Skin is warm and dry without rash. Neurologic: Mental status is normal, cranial nerves are intact, there are no motor or sensory deficits.  ED Course  Procedures (including critical care time)  Results for orders placed during the hospital encounter of 12/30/12  COMPREHENSIVE METABOLIC PANEL      Result Value Range   Sodium 133 (*) 135 - 145 mEq/L   Potassium 3.4 (*) 3.5 - 5.1 mEq/L   Chloride 99  96 - 112 mEq/L   CO2 23  19 - 32 mEq/L   Glucose, Bld 82  70 - 99 mg/dL   BUN 14  6 - 23 mg/dL   Creatinine, Ser 4.09 (*) 0.50 - 1.10 mg/dL   Calcium 9.3  8.4 - 81.1 mg/dL    Total Protein 7.7  6.0 - 8.3 g/dL   Albumin 4.0  3.5 - 5.2 g/dL   AST 11  0 - 37 U/L   ALT 6  0 - 35 U/L   Alkaline Phosphatase 55  39 - 117 U/L   Total Bilirubin 0.6  0.3 - 1.2 mg/dL   GFR calc non Af Amer 40 (*) >90 mL/min   GFR calc Af Amer 47 (*) >90 mL/min  CBC WITH DIFFERENTIAL      Result Value Range   WBC 14.2 (*) 4.0 - 10.5 K/uL   RBC 4.26  3.87 - 5.11 MIL/uL   Hemoglobin 11.9 (*) 12.0 - 15.0 g/dL   HCT 91.4  78.2 - 95.6 %   MCV 86.6  78.0 - 100.0 fL   MCH 27.9  26.0 - 34.0 pg   MCHC 32.2  30.0 - 36.0 g/dL   RDW 21.3  08.6 - 57.8 %   Platelets 234  150 - 400 K/uL   Neutrophils Relative 82 (*) 43 - 77 %   Neutro Abs 11.7 (*) 1.7 - 7.7 K/uL   Lymphocytes Relative 10 (*) 12 - 46 %   Lymphs Abs 1.4  0.7 - 4.0 K/uL   Monocytes Relative 7  3 - 12 %   Monocytes Absolute 1.0  0.1 - 1.0 K/uL   Eosinophils Relative 1  0 - 5 %   Eosinophils Absolute 0.2  0.0 - 0.7 K/uL   Basophils Relative 0  0 - 1 %   Basophils Absolute 0.0  0.0 - 0.1 K/uL  LIPASE, BLOOD      Result Value Range   Lipase 16  11 - 59 U/L  URINALYSIS, ROUTINE W REFLEX MICROSCOPIC      Result Value Range   Color, Urine YELLOW  YELLOW   APPearance CLEAR  CLEAR   Specific Gravity, Urine 1.018  1.005 - 1.030   pH 6.5  5.0 - 8.0   Glucose, UA NEGATIVE  NEGATIVE mg/dL   Hgb urine dipstick NEGATIVE  NEGATIVE   Bilirubin Urine NEGATIVE  NEGATIVE   Ketones, ur NEGATIVE  NEGATIVE mg/dL   Protein, ur NEGATIVE  NEGATIVE mg/dL   Urobilinogen, UA 1.0  0.0 - 1.0 mg/dL   Nitrite NEGATIVE  NEGATIVE   Leukocytes, UA TRACE (*) NEGATIVE  URINE MICROSCOPIC-ADD ON  Result Value Range   Squamous Epithelial / LPF RARE  RARE   WBC, UA 0-2  <3 WBC/hpf   RBC / HPF 0-2  <3 RBC/hpf   US Abdomen Complete  12/30/2012  *RADIOLOGY REPORT*  Clinical Data:  Right upper quadrant abdominal pain for 2 days.  ABDOMINAL ULTRASOUND COMPLETE  Comparison:  CT of the abdomen and pelvis performed 12/29/2012  Findings:  Gallbladder:  The  gallbladder is normal in appearance, without evidence for gallstones, gallbladder wall thickening or pericholecystic fluid.  No ultrasonographic Murphy's sign is elicited.  Common Bile Duct:  0.4 cm in diameter; within normal limits in caliber.  Liver:  Normal parenchymal echogenicity and echotexture; no focal lesions identified.  Limited Doppler evaluation demonstrates normal blood flow within the liver.  IVC:  Unremarkable in appearance.  Pancreas:  Although the pancreas is difficult to visualize in its entirety due to overlying bowel gas, no focal pancreatic abnormality is identified.  Spleen:  7.8 cm in length; within normal limits in size and echotexture.  Right kidney:  10.7 cm in length; normal in size, configuration and parenchymal echogenicity.  No evidence of mass or hydronephrosis. Trace free fluid is noted tracking about the inferior pole of the right kidney; this is also characterized on the recent CT.  Left kidney:  11.1 cm in length; normal in size, configuration and parenchymal echogenicity.  No evidence of mass or hydronephrosis.  Abdominal Aorta:  Normal in caliber; no aneurysm identified.  IMPRESSION:  1.  Unremarkable abdominal ultrasound. 2.  Trace free fluid noted tracking about the inferior pole of the right kidney; this is also present on the recent CT, and is of uncertain significance.  No definite evidence for pyelonephritis; no evidence of obstruction.   Original Report Authenticated By: Tonia Ghent, M.D.    Ct Abdomen Pelvis W Contrast  12/29/2012  *RADIOLOGY REPORT*  Clinical Data: Right-sided abdomen and right flank pain, some nausea and vomiting  CT ABDOMEN AND PELVIS WITH CONTRAST  Technique:  Multidetector CT imaging of the abdomen and pelvis was performed following the standard protocol during bolus administration of intravenous contrast.  Contrast: OMNIPAQUE IOHEXOL 300 MG/ML  SOLN  Comparison: None.  Findings: The lung bases are clear.  The liver enhances with no focal  abnormality and no ductal dilatation is seen.  No calcified gallstones are noted.  The pancreas is normal in size and the pancreatic duct is not dilated.  The adrenal glands and spleen are unremarkable.  The stomach is decompressed.  The kidneys enhance with no calculus or mass and no hydronephrosis is seen.  The abdominal aorta is normal in caliber.  No adenopathy is seen.  The uterus is anteverted and unremarkable.  Probable small ovarian follicles are noted and there appears to be a collapsing cyst anteriorly on the left of 2.2 cm with a small amount of free fluid adjacent.  The urinary bladder is decompressed.  No abnormality of the colon is seen.  The appendix is moderately well visualized, and portions of the appendix fill with air, with no evidence of an inflammatory process noted.  The terminal ileum is unremarkable. Mild lumbar curvature convex to the left is noted.  The lumbar vertebrae are in normal alignment with normal disc spaces.  IMPRESSION:  1.  Probable collapsing left ovarian cyst possibly a corpus luteum cyst, with a small amount of free fluid adjacent. 2.  The appendix is well seen and appears normal, and the terminal ileum is normal.   Original  Report Authenticated By: Dwyane Dee, M.D.       1. Abdominal pain   2. Renal insufficiency       MDM  Flank and abdominal pain of uncertain cause. Records from ED visit yesterday were reviewed and pregnancy test was negative and CT scans showed collapsing left ovarian cyst with free fluid in the pelvis. It was noted that this does not fit with where her pain is. Of note, urinalysis had too numerous to count RBCs as well as many bacteria although many epithelial cells are also present. She may have a urinary tract infection. Catheterized urine will be obtained. I reviewed CT scan from yesterday and she absolutely no evidence of ureterolithiasis.  Catheterized urine did not show evidence of urinary tract infection. Abdominal ultrasound was  unremarkable. Laboratory workup shows a slight rise in WBC from 126 to 14.2, and a significant rise in creatinine from 0.80 to 1.61. Antonietta Barcelona concerned about this rise in creatinine. Patient did get relief of pain with hydromorphone but pain recurred. She continued to get to a pain reliever from additional hydromorphone. Case is discussed with Dr. Lovell Sheehan of [facility]. She will be admitted under observation status to continue IV fluids, IV narcotics, and to recheck her creatinine to make sure that she is not nearly stages of acute renal failure.     Dione Booze, MD 12/31/12 [id number]

## 2012-12-31 ENCOUNTER — Encounter (HOSPITAL_COMMUNITY): Payer: Self-pay | Admitting: *Deleted

## 2012-12-31 DIAGNOSIS — D72829 Elevated white blood cell count, unspecified: Secondary | ICD-10-CM | POA: Diagnosis present

## 2012-12-31 DIAGNOSIS — N289 Disorder of kidney and ureter, unspecified: Secondary | ICD-10-CM | POA: Diagnosis present

## 2012-12-31 DIAGNOSIS — R109 Unspecified abdominal pain: Secondary | ICD-10-CM

## 2012-12-31 DIAGNOSIS — R112 Nausea with vomiting, unspecified: Secondary | ICD-10-CM | POA: Diagnosis present

## 2012-12-31 LAB — BASIC METABOLIC PANEL
BUN: 12 mg/dL (ref 6–23)
CO2: 22 mEq/L (ref 19–32)
Chloride: 106 mEq/L (ref 96–112)
Creatinine, Ser: 1.45 mg/dL — ABNORMAL HIGH (ref 0.50–1.10)

## 2012-12-31 LAB — CBC
HCT: 32.9 % — ABNORMAL LOW (ref 36.0–46.0)
MCHC: 31.6 g/dL (ref 30.0–36.0)
MCV: 88 fL (ref 78.0–100.0)
RDW: 13.3 % (ref 11.5–15.5)

## 2012-12-31 MED ORDER — HYDROMORPHONE HCL PF 1 MG/ML IJ SOLN
1.0000 mg | INTRAMUSCULAR | Status: DC | PRN
  Administered 2012-12-31: 1 mg via INTRAVENOUS
  Filled 2012-12-31 (×2): qty 1

## 2012-12-31 MED ORDER — ONDANSETRON HCL 4 MG PO TABS: 4.0000 mg | ORAL_TABLET | Freq: Four times a day (QID) | ORAL | Status: DC | PRN

## 2012-12-31 MED ORDER — ACETAMINOPHEN 650 MG RE SUPP: 650.0000 mg | Freq: Four times a day (QID) | RECTAL | Status: DC | PRN

## 2012-12-31 MED ORDER — HYDROMORPHONE HCL PF 1 MG/ML IJ SOLN
0.5000 mg | INTRAMUSCULAR | Status: DC | PRN
  Administered 2012-12-31 – 2013-01-02 (×9): 1 mg via INTRAVENOUS
  Filled 2012-12-31 (×9): qty 1

## 2012-12-31 MED ORDER — ENOXAPARIN SODIUM 40 MG/0.4ML ~~LOC~~ SOLN
40.0000 mg | Freq: Every day | SUBCUTANEOUS | Status: DC
  Administered 2012-12-31 – 2013-01-02 (×3): 40 mg via SUBCUTANEOUS
  Filled 2012-12-31 (×3): qty 0.4

## 2012-12-31 MED ORDER — METOCLOPRAMIDE HCL 5 MG/ML IJ SOLN
5.0000 mg | Freq: Three times a day (TID) | INTRAMUSCULAR | Status: DC
  Administered 2012-12-31 – 2013-01-01 (×5): 5 mg via INTRAVENOUS
  Administered 2013-01-02: 12:00:00 via INTRAVENOUS
  Administered 2013-01-02: 5 mg via INTRAVENOUS
  Filled 2012-12-31: qty 1
  Filled 2012-12-31: qty 2
  Filled 2012-12-31 (×5): qty 1
  Filled 2012-12-31: qty 2
  Filled 2012-12-31: qty 1
  Filled 2012-12-31: qty 2

## 2012-12-31 MED ORDER — HYDROMORPHONE HCL PF 1 MG/ML IJ SOLN
0.5000 mg | Freq: Once | INTRAMUSCULAR | Status: AC
  Administered 2012-12-31: 0.5 mg via INTRAVENOUS

## 2012-12-31 MED ORDER — SODIUM CHLORIDE 0.9 % IV SOLN
INTRAVENOUS | Status: DC
  Administered 2012-12-31 – 2013-01-02 (×6): via INTRAVENOUS

## 2012-12-31 MED ORDER — ZOLPIDEM TARTRATE 5 MG PO TABS
5.0000 mg | ORAL_TABLET | Freq: Every evening | ORAL | Status: DC | PRN
  Administered 2012-12-31: 5 mg via ORAL
  Filled 2012-12-31: qty 1

## 2012-12-31 MED ORDER — SODIUM CHLORIDE 0.9 % IV SOLN: INTRAVENOUS | Status: AC

## 2012-12-31 MED ORDER — ALBUTEROL SULFATE (5 MG/ML) 0.5% IN NEBU: 2.5000 mg | INHALATION_SOLUTION | Freq: Four times a day (QID) | RESPIRATORY_TRACT | Status: DC | PRN

## 2012-12-31 MED ORDER — ACETAMINOPHEN 325 MG PO TABS: 650.0000 mg | ORAL_TABLET | Freq: Four times a day (QID) | ORAL | Status: DC | PRN

## 2012-12-31 MED ORDER — OXYCODONE HCL 5 MG PO TABS
5.0000 mg | ORAL_TABLET | ORAL | Status: DC | PRN
  Administered 2012-12-31 – 2013-01-01 (×4): 5 mg via ORAL
  Filled 2012-12-31 (×4): qty 1

## 2012-12-31 MED ORDER — ONDANSETRON HCL 4 MG/2ML IJ SOLN
4.0000 mg | Freq: Four times a day (QID) | INTRAMUSCULAR | Status: DC | PRN
  Administered 2012-12-31: 4 mg via INTRAVENOUS
  Filled 2012-12-31: qty 2

## 2012-12-31 MED ORDER — ALUM & MAG HYDROXIDE-SIMETH 200-200-20 MG/5ML PO SUSP: 30.0000 mL | Freq: Four times a day (QID) | ORAL | Status: DC | PRN

## 2012-12-31 MED ORDER — ONDANSETRON HCL 4 MG/2ML IJ SOLN: 4.0000 mg | Freq: Three times a day (TID) | INTRAMUSCULAR | Status: DC | PRN

## 2012-12-31 MED ORDER — FLUOXETINE HCL 20 MG PO CAPS
20.0000 mg | ORAL_CAPSULE | Freq: Every day | ORAL | Status: DC
  Administered 2012-12-31 – 2013-01-02 (×3): 20 mg via ORAL
  Filled 2012-12-31 (×3): qty 1

## 2012-12-31 NOTE — Progress Notes (Signed)
Have seen and examined patient with this 37 year old admitted prolapsing consists a.m. with right sided abdominal/flank pain-CT scan of abdomen and pelvis with probable collapsing left ovarian cyst possibly a corpus luteum cyst with small amount of free fluid, appendix normal, and ultrasound of abdomen unremarkable-no pyelonephritis no evidence of hydronephrosis, urinalysis negative. Still with complaints of nausea, and right-sided pain. Continue current management plan as per Dr. Lovell Sheehan, once pain controlled, and able to tolerate by mouth will plan discharge for outpatient followup with her GYN.

## 2012-12-31 NOTE — H&P (Signed)
[facility] History and Physical  [name] UJW:[id number] DOB: 1976-01-14 DOA: 12/30/2012  Referring physician: EDP PCP: Willow Ora, MD  Specialists:   Chief Complaint: ABD and Flank Pain  HPI: Danielle Bass is a 37 y.o. female who returns to the ED with complaints of worsening and continued lower ABD Pain along with nausea and vomiting.   She reports that she has not been able to hold down foods or liquids today.  She denies hematemesis.  The pain has been described as sharp and crampy and was on the left side when she went to the ED yesterday  And had a CT scan which was negative except for findings of free fluid at the right inferior pole of the kidney and a collapsing right ovarian follicle.   An [place] was performed to evaluate for hydronephrosis due to  An increase in her creatinine and it was negative for findings of hydronephrosis.      Review of Systems: The patient denies anorexia, fever,chills, weight loss, vision loss, decreased hearing, hoarseness, chest pain, syncope, dyspnea on exertion, peripheral edema, balance deficits, hemoptysis, abdominal pain,constipation, diarrhea, hematemesis, melena, hematochezia, severe indigestion/heartburn, hematuria, incontinence, muscle weakness, suspicious skin lesions, transient blindness, difficulty walking, depression, unusual weight change, abnormal bleeding, enlarged lymph nodes, angioedema, and breast masses.    Past Medical History  Diagnosis Date  . Eczema   . Asthma   . Bronchitis   . Chronic back pain    Past Surgical History  Procedure Laterality Date  . Leg surgery      tib fib left?    Medications:  HOME MEDS: Prior to Admission medications   Medication Sig Start Date End Date Taking? Authorizing Provider  diclofenac (VOLTAREN) 75 MG EC tablet Take 75 mg by mouth 2 (two) times daily.   Yes Historical Provider, MD  FLUoxetine (PROZAC) 20 MG capsule Take 20 mg by mouth every morning.   Yes Historical Provider,  MD  HYDROcodone-acetaminophen (NORCO/VICODIN) 5-325 MG per tablet Take 0.5-1 tablets by mouth every 6 (six) hours as needed for pain.   Yes Historical Provider, MD  montelukast (SINGULAIR) 10 MG tablet Take 10 mg by mouth at bedtime.   Yes Historical Provider, MD  Omalizumab [place]) Inject 1 each into the skin every 14 (fourteen) days. Asthma injection gets at Dr Lucie Leather office   Yes Historical Provider, MD  ondansetron (ZOFRAN) 4 MG tablet Take 2 tablets (8 mg total) by mouth every 8 (eight) hours as needed for nausea. 12/29/12  Yes Doug Sou, MD    Allergies:  Allergies  Allergen Reactions  . Aspirin Other (See Comments)    "doctor doesn't want her to take"    Social History:   reports that she has never smoked. She does not have any smokeless tobacco history on file. She reports that she does not drink alcohol or use illicit drugs.  Family History: Family History  Problem Relation Age of Onset  . Macular degeneration Mother       Physical Exam:  GEN:  Pleasant 37 year old well nourished and well developed African American Female examined  and in no acute distress; cooperative with exam Filed Vitals:   12/30/12 2230 12/30/12 2238 12/31/12 0121 12/31/12 0159  BP: 119/69  114/76 128/84  Pulse: 77 75 74 71  Temp:   99 F (37.2 C) 98.2 F (36.8 C)  TempSrc:   Oral Oral  Resp:      Height:    5\' 4"  (1.626 m)  Weight:    62.143 kg (137 lb)  SpO2: 88% 99% 100% 100%   Blood pressure 128/84, pulse 71, temperature 98.2 F (36.8 C), temperature source Oral, resp. rate 19, height 5\' 4"  (1.626 m), weight 62.143 kg (137 lb), last menstrual period 12/15/2012, SpO2 100.00%. PSYCH: She is alert and oriented x4; does not appear anxious does not appear depressed; affect is normal HEENT: Normocephalic and Atraumatic, Mucous membranes pink; PERRLA; EOM intact; Fundi:  Benign;  No scleral icterus, Nares: Patent, Oropharynx: Clear, Edentulous or Fair Dentition, Neck:  FROM, no cervical  lymphadenopathy nor thyromegaly or carotid bruit; no JVD; Breasts:: Not examined CHEST WALL: No tenderness CHEST: Normal respiration, clear to auscultation bilaterally HEART: Regular rate and rhythm; no murmurs rubs or gallops BACK: No kyphosis or scoliosis; no CVA tenderness ABDOMEN: Positive Bowel Sounds, Scaphoid, Obese, soft non-tender; no masses, no organomegaly, no pannus; no intertriginous candida. Rectal Exam: Not done EXTREMITIES: No cyanosis, clubbing or edema; no ulcerations. Genitalia: not examined PULSES: 2+ and symmetric SKIN: Normal hydration, no ulceration.   + Hypopigmented area of the extensor surface of the Right ForeArm.   CNS: Cranial nerves 2-12 grossly intact no focal neurologic deficit    Labs on Admission:  Basic Metabolic Panel:  Recent Labs Lab 12/29/12 0815 12/30/12 2015  NA 135 133*  K 3.5 3.4*  CL 99 99  CO2 22 23  GLUCOSE 82 82  BUN 12 14  CREATININE 0.84 1.61*  CALCIUM 9.7 9.3   Liver Function Tests:  Recent Labs Lab 12/29/12 0815 12/30/12 2015  AST 12 11  ALT 5 6  ALKPHOS 58 55  BILITOT 0.9 0.6  PROT 8.3 7.7  ALBUMIN 4.4 4.0    Recent Labs Lab 12/29/12 0815 12/30/12 2015  LIPASE 18 16   No results found for this basename: AMMONIA,  in the last 168 hours CBC:  Recent Labs Lab 12/29/12 0815 12/30/12 2015  WBC 12.6* 14.2*  NEUTROABS 10.7* 11.7*  HGB 12.7 11.9*  HCT 38.7 36.9  MCV 86.2 86.6  PLT 244 234   Cardiac Enzymes: No results found for this basename: CKTOTAL, CKMB, CKMBINDEX, TROPONINI,  in the last 168 hours  BNP (last 3 results) No results found for this basename: PROBNP,  in the last 8760 hours CBG: No results found for this basename: GLUCAP,  in the last 168 hours  Radiological Exams on Admission: US Abdomen Complete  12/30/2012  *RADIOLOGY REPORT*  Clinical Data:  Right upper quadrant abdominal pain for 2 days.  ABDOMINAL ULTRASOUND COMPLETE  Comparison:  CT of the abdomen and pelvis performed  12/29/2012  Findings:  Gallbladder:  The gallbladder is normal in appearance, without evidence for gallstones, gallbladder wall thickening or pericholecystic fluid.  No ultrasonographic Murphy's sign is elicited.  Common Bile Duct:  0.4 cm in diameter; within normal limits in caliber.  Liver:  Normal parenchymal echogenicity and echotexture; no focal lesions identified.  Limited Doppler evaluation demonstrates normal blood flow within the liver.  IVC:  Unremarkable in appearance.  Pancreas:  Although the pancreas is difficult to visualize in its entirety due to overlying bowel gas, no focal pancreatic abnormality is identified.  Spleen:  7.8 cm in length; within normal limits in size and echotexture.  Right kidney:  10.7 cm in length; normal in size, configuration and parenchymal echogenicity.  No evidence of mass or hydronephrosis. Trace free fluid is noted tracking about the inferior pole of the right kidney; this is also characterized on the recent CT.  Left  kidney:  11.1 cm in length; normal in size, configuration and parenchymal echogenicity.  No evidence of mass or hydronephrosis.  Abdominal Aorta:  Normal in caliber; no aneurysm identified.  IMPRESSION:  1.  Unremarkable abdominal ultrasound. 2.  Trace free fluid noted tracking about the inferior pole of the right kidney; this is also present on the recent CT, and is of uncertain significance.  No definite evidence for pyelonephritis; no evidence of obstruction.   Original Report Authenticated By: Tonia Ghent, M.D.    Ct Abdomen Pelvis W Contrast  12/29/2012  *RADIOLOGY REPORT*  Clinical Data: Right-sided abdomen and right flank pain, some nausea and vomiting  CT ABDOMEN AND PELVIS WITH CONTRAST  Technique:  Multidetector CT imaging of the abdomen and pelvis was performed following the standard protocol during bolus administration of intravenous contrast.  Contrast: OMNIPAQUE IOHEXOL 300 MG/ML  SOLN  Comparison: None.  Findings: The lung bases are  clear.  The liver enhances with no focal abnormality and no ductal dilatation is seen.  No calcified gallstones are noted.  The pancreas is normal in size and the pancreatic duct is not dilated.  The adrenal glands and spleen are unremarkable.  The stomach is decompressed.  The kidneys enhance with no calculus or mass and no hydronephrosis is seen.  The abdominal aorta is normal in caliber.  No adenopathy is seen.  The uterus is anteverted and unremarkable.  Probable small ovarian follicles are noted and there appears to be a collapsing cyst anteriorly on the left of 2.2 cm with a small amount of free fluid adjacent.  The urinary bladder is decompressed.  No abnormality of the colon is seen.  The appendix is moderately well visualized, and portions of the appendix fill with air, with no evidence of an inflammatory process noted.  The terminal ileum is unremarkable. Mild lumbar curvature convex to the left is noted.  The lumbar vertebrae are in normal alignment with normal disc spaces.  IMPRESSION:  1.  Probable collapsing left ovarian cyst possibly a corpus luteum cyst, with a small amount of free fluid adjacent. 2.  The appendix is well seen and appears normal, and the terminal ileum is normal.   Original Report Authenticated By: Dwyane Dee, M.D.        Assessment/Plan Active Problems:   Abdominal pain   Renal insufficiency   Nausea and vomiting   Leukocytosis, unspecified   Atopic Asthma   1.   Abd Pain- ? Etiology  Colic versus Ovarian cyst pain, Pain control, and antiemetics ordered, placed on clear liquid diet, and IVFs.    2.   Renal Insufficiency- [place] negative for hydronephosis or evidence of renal stones.  Should correct with IVFs.   Monitor BUN/Cr.    3.  Nausea and vomiting-  See #1.    4.   Leukocytosis-  Monitor .     5.  Atopic Asthma- Stable, nebs PRN.      Code Status:   FULL CODE Family Communication:  No Family at Bedside      Disposition Plan:    Return to Home on  Discharge  Time spent:  66 Minutes  Ron Parker [facility] Pager [phone]  If 7PM-7AM, please contact night-coverage [email or web address] Password [email or web address] 12/31/2012, 2:42 AM

## 2013-01-01 DIAGNOSIS — N289 Disorder of kidney and ureter, unspecified: Secondary | ICD-10-CM

## 2013-01-01 LAB — CBC
MCH: 28.2 pg (ref 26.0–34.0)
MCHC: 32 g/dL (ref 30.0–36.0)
Platelets: 179 10*3/uL (ref 150–400)
RDW: 13.2 % (ref 11.5–15.5)

## 2013-01-01 MED ORDER — OXYCODONE HCL 5 MG PO TABS
5.0000 mg | ORAL_TABLET | Freq: Four times a day (QID) | ORAL | Status: DC | PRN
  Administered 2013-01-02 (×3): 5 mg via ORAL
  Filled 2013-01-01 (×3): qty 1

## 2013-01-01 NOTE — Care Management Note (Signed)
    Page 1 of 1   01/01/2013     12:11:41 PM   CARE MANAGEMENT NOTE 01/01/2013  Patient:  Danielle Bass, Danielle Bass   Account Number:  [id number]  Date Initiated:  01/01/2013  Documentation initiated by:  Lorenda Ishihara  Subjective/Objective Assessment:   37 yo female admitted with abd pain related to ruptured ovarian cyst. PTA lived at home with friends and family.     Action/Plan:   Home when stable   Anticipated DC Date:  01/01/2013   Anticipated DC Plan:  HOME/SELF CARE      DC Planning Services  CM consult      Choice offered to / List presented to:             Status of service:  Completed, signed off Medicare Important Message given?  NO (If response is "NO", the following Medicare IM given date fields will be blank) Date Medicare IM given:   Date Additional Medicare IM given:    Discharge Disposition:  HOME/SELF CARE  Per UR Regulation:  Reviewed for med. necessity/level of care/duration of stay  If discussed at Long Length of Stay Meetings, dates discussed:    Comments:

## 2013-01-01 NOTE — Progress Notes (Signed)
[facility] PROGRESS NOTE  Danielle Bass VHQ:[id number] DOB: [birth year] DOA: 12/30/2012 PCP: Willow Ora, MD  Assessment/Plan: 1.Abd Pain- R sided - and CT  With collapsing ovarian cyst, [place] neg for hydro and no evidence of hydronephritis, lipase nl. -UA neg and Urine culture with no growth so far -improving with pain management, advance diet to solids -pt to follow up with her GYN upon d/c 2.Dizziness ?secondary to meds, will decrease frequench=y of oxycodone -obtain orthostatics and follow 2. Acute Renal Insufficiency- [place] negative for hydronephosis or evidence of renal stones.  -improving with IVF, follow and recheck  3. Nausea and vomiting- resolved, See #1.  4. Leukocytosis- likely reactive, resolved on no abx and urine culture neg .  5. Atopic Asthma- Stable, nebs PRN   Code Status: full Family Communication: mother at bedside Disposition Plan: to home possibly in am if continue to improve.   Consultants:  none  Procedures:  none  Antibiotics:  none  HPI/Subjective: States decreased abd pain, no further nausea or vomiting but c/o some dizziness today while in a bed  Objective: Filed Vitals:   12/31/12 1545 12/31/12 2100 01/01/13 0545 01/01/13 1400  BP: 121/84 109/74 119/78 121/69  Pulse: 75 99 77 67  Temp: 98.2 F (36.8 C) 98.1 F (36.7 C) 99.3 F (37.4 C) 98.9 F (37.2 C)  TempSrc: Oral Oral Oral Oral  Resp: 16 18 18 18   Height:      Weight:      SpO2: 100% 98% 98% 100%    Intake/Output Summary (Last 24 hours) at 01/01/13 1818 Last data filed at 01/01/13 1400  Gross per 24 hour  Intake 1948.75 ml  Output    900 ml  Net 1048.75 ml   Filed Weights   12/31/12 0159  Weight: 62.143 kg (137 lb)    Exam:   General:  Young female, alert and oriented in NAD  Cardiovascular: RRR, nl S1S2  Respiratory: CTAB, nl S1S2  Abdomen: soft,+BS,ND, decreased right sided tenderness  Extremities:no cyanosis and no edema  Data Reviewed: Basic  Metabolic Panel:  Recent Labs Lab 12/29/12 0815 12/30/12 2015 12/31/12 0425  NA 135 133* 138  K 3.5 3.4* 3.5  CL 99 99 106  CO2 22 23 22   GLUCOSE 82 82 96  BUN 12 14 12   CREATININE 0.84 1.61* 1.45*  CALCIUM 9.7 9.3 8.1*   Liver Function Tests:  Recent Labs Lab 12/29/12 0815 12/30/12 2015  AST 12 11  ALT 5 6  ALKPHOS 58 55  BILITOT 0.9 0.6  PROT 8.3 7.7  ALBUMIN 4.4 4.0    Recent Labs Lab 12/29/12 0815 12/30/12 2015  LIPASE 18 16   No results found for this basename: AMMONIA,  in the last 168 hours CBC:  Recent Labs Lab 12/29/12 0815 12/30/12 2015 12/31/12 0425 01/01/13 0445  WBC 12.6* 14.2* 11.3* 8.2  NEUTROABS 10.7* 11.7*  --   --   HGB 12.7 11.9* 10.4* 9.7*  HCT 38.7 36.9 32.9* 30.3*  MCV 86.2 86.6 88.0 88.1  PLT 244 234 208 179   Cardiac Enzymes: No results found for this basename: CKTOTAL, CKMB, CKMBINDEX, TROPONINI,  in the last 168 hours BNP (last 3 results) No results found for this basename: PROBNP,  in the last 8760 hours CBG: No results found for this basename: GLUCAP,  in the last 168 hours  Recent Results (from the past 240 hour(s))  URINE CULTURE     Status: None   Collection Time    12/29/12  7:57 AM      Result Value Range Status   Specimen Description URINE, CLEAN CATCH   Final   Special Requests NONE   Final   Culture  Setup Time 12/29/2012 15:23   Final   Colony Count NO GROWTH   Final   Culture NO GROWTH   Final   Report Status 12/30/2012 FINAL   Final     Studies: US Abdomen Complete  12/30/2012  *RADIOLOGY REPORT*  Clinical Data:  Right upper quadrant abdominal pain for 2 days.  ABDOMINAL ULTRASOUND COMPLETE  Comparison:  CT of the abdomen and pelvis performed 12/29/2012  Findings:  Gallbladder:  The gallbladder is normal in appearance, without evidence for gallstones, gallbladder wall thickening or pericholecystic fluid.  No ultrasonographic Murphy's sign is elicited.  Common Bile Duct:  0.4 cm in diameter; within normal  limits in caliber.  Liver:  Normal parenchymal echogenicity and echotexture; no focal lesions identified.  Limited Doppler evaluation demonstrates normal blood flow within the liver.  IVC:  Unremarkable in appearance.  Pancreas:  Although the pancreas is difficult to visualize in its entirety due to overlying bowel gas, no focal pancreatic abnormality is identified.  Spleen:  7.8 cm in length; within normal limits in size and echotexture.  Right kidney:  10.7 cm in length; normal in size, configuration and parenchymal echogenicity.  No evidence of mass or hydronephrosis. Trace free fluid is noted tracking about the inferior pole of the right kidney; this is also characterized on the recent CT.  Left kidney:  11.1 cm in length; normal in size, configuration and parenchymal echogenicity.  No evidence of mass or hydronephrosis.  Abdominal Aorta:  Normal in caliber; no aneurysm identified.  IMPRESSION:  1.  Unremarkable abdominal ultrasound. 2.  Trace free fluid noted tracking about the inferior pole of the right kidney; this is also present on the recent CT, and is of uncertain significance.  No definite evidence for pyelonephritis; no evidence of obstruction.   Original Report Authenticated By: Tonia Ghent, M.D.     Scheduled Meds: . enoxaparin (LOVENOX) injection  40 mg Subcutaneous Daily  . FLUoxetine  20 mg Oral Daily  . metoCLOPramide (REGLAN) injection  5 mg Intravenous TID AC   Continuous Infusions: . sodium chloride 125 mL/hr at 01/01/13 1805    Active Problems:   Abdominal pain   Renal insufficiency   Leukocytosis, unspecified   Nausea and vomiting    Time spent:    Kela Millin  [facility] Pager [phone]. If 8PM-8AM, please contact night-coverage at [email or web address], password [facility] 01/01/2013, 6:18 PM  LOS: 2 days

## 2013-01-02 DIAGNOSIS — R112 Nausea with vomiting, unspecified: Secondary | ICD-10-CM

## 2013-01-02 DIAGNOSIS — N83209 Unspecified ovarian cyst, unspecified side: Principal | ICD-10-CM

## 2013-01-02 DIAGNOSIS — R109 Unspecified abdominal pain: Secondary | ICD-10-CM

## 2013-01-02 DIAGNOSIS — E876 Hypokalemia: Secondary | ICD-10-CM | POA: Diagnosis not present

## 2013-01-02 DIAGNOSIS — D72829 Elevated white blood cell count, unspecified: Secondary | ICD-10-CM

## 2013-01-02 DIAGNOSIS — N83202 Unspecified ovarian cyst, left side: Secondary | ICD-10-CM | POA: Diagnosis present

## 2013-01-02 LAB — BASIC METABOLIC PANEL
Calcium: 8.4 mg/dL (ref 8.4–10.5)
GFR calc non Af Amer: 85 mL/min — ABNORMAL LOW (ref >90)
Sodium: 134 mEq/L — ABNORMAL LOW (ref 135–145)

## 2013-01-02 LAB — MAGNESIUM: Magnesium: 1.6 mg/dL (ref 1.5–2.5)

## 2013-01-02 MED ORDER — DOCUSATE SODIUM 100 MG PO CAPS: 100.0000 mg | ORAL_CAPSULE | Freq: Two times a day (BID) | ORAL | Status: DC

## 2013-01-02 MED ORDER — POTASSIUM CHLORIDE CRYS ER 20 MEQ PO TBCR
40.0000 meq | EXTENDED_RELEASE_TABLET | ORAL | Status: AC
  Administered 2013-01-02 (×2): 40 meq via ORAL
  Filled 2013-01-02 (×2): qty 2

## 2013-01-02 MED ORDER — OXYCODONE HCL 5 MG PO TABS: 5.0000 mg | ORAL_TABLET | Freq: Four times a day (QID) | ORAL | Status: DC | PRN

## 2013-01-02 MED ORDER — ONDANSETRON HCL 4 MG PO TABS: 8.0000 mg | ORAL_TABLET | Freq: Four times a day (QID) | ORAL | Status: DC | PRN

## 2013-01-02 NOTE — Discharge Summary (Addendum)
Physician Discharge Summary  Danielle Bass ZOX:[id number] DOB: 1976/02/24 DOA: 12/30/2012  PCP: Willow Ora, MD  Admit date: 12/30/2012 Discharge date: 01/02/2013  Time spent: 65 minutes  Recommendations for Outpatient Follow-up:  1. Patient is to followup with PCP one week post discharge. On followup patient will need a basic metabolic profile done to followup on electrolytes and renal function. Patient also need a CBC done to followup on her leukocytosis and hemoglobin. Patient hasn't seen a GYN will need a referral. 2. Patient has been given information to call to set up appointment to be seen by GYN as an outpatient as soon as possible for further evaluation of a collapsing ovarian cyst.  Discharge Diagnoses:  Principal Problem:   Abdominal pain Active Problems:   Renal insufficiency   Leukocytosis, unspecified   Nausea and vomiting   Left ovarian cyst   Hypokalemia   Discharge Condition: Stable.  Diet recommendation: Regular  Filed Weights   12/31/12 0159  Weight: 62.143 kg (137 lb)    History of present illness:  Danielle Bass is a 37 y.o. female who returns to the ED with complaints of worsening and continued lower ABD Pain along with nausea and vomiting. She reports that she has not been able to hold down foods or liquids today. She denies hematemesis. The pain has been described as sharp and crampy and was on the left side when she went to the ED yesterday And had a CT scan which was negative except for findings of free fluid at the right inferior pole of the kidney and a collapsing right ovarian follicle. An [place] was performed to evaluate for hydronephrosis due to An increase in her creatinine and it was negative for findings of hydronephrosis   Hospital Course:  #1 right-sided abdominal pain/nausea and vomiting Patient was admitted with nausea vomiting no abdominal pain unable to keep anything down. Patient denied hematemesis on presentation. Patient was seen in the  ED she had a CT scan which was done which was only remarkable for a collapsing ovarian cyst otherwise was negative. Ultrasound was also done to evaluate her elevated creatinine which was negative for hydronephrosis. Lipase levels were obtained which were within normal limits. Patient was placed on IV fluids, pain management, anti-medics, supportive care. Urinalysis which was done was negative for UTI. Patient was monitored and followed patient improved slowly was tolerating a diet. Patient was placed on IV pain medications as well as oral pain medications for pain management. Due to negative workup it was felt that patient's abdominal pain was likely secondary to her collapsing ovarian cyst. It was recommended that patient followup with OB/GYN as outpatient for further evaluation and management. Patient will be discharged in stable condition.  #2 acute renal insufficiency On admission patient was noted to have an acute renal insufficiency with a creatinine up as high as 1.61. Abdominal ultrasound which was done was negative for hydronephrosis. It was felt this was likely secondary to prerenal azotemia. Patient was hydrated with IV fluids with resolution of her acute renal insufficiency. Patient be discharged home in stable and improved condition.  #3 hypokalemia Patient was noted to be hypokalemic which was felt to be secondary to GI losses. Patient's potassium was repleted. Patient will followup with PCP as outpatient.  #4 leukocytosis On admission patient was noted to have a leukocytosis which was felt to be reactive. Urinalysis which was done was negative for urinary tract infection. Patient did not have any respiratory symptoms. Urine cultures which were also  done were negative. Patient was not placed on any antibiotics. Patient's leukocytosis trended down and had resolved by day of discharge.    Procedures:  CT abdomen and pelvis 12/29/2012  Abdominal ultrasound  12/30/2012  Consultations:  None  Discharge Exam: Filed Vitals:   01/02/13 0154 01/02/13 0623 01/02/13 0624 01/02/13 0625  BP: 131/84 106/70 125/78 131/83  Pulse: 84 85 95 90  Temp: 97.2 F (36.2 C) 97.9 F (36.6 C)    TempSrc: Oral Oral    Resp: 18 18    Height:      Weight:      SpO2: 100% 100%      General: NAD Cardiovascular: RRR Respiratory: CTAB  Discharge Instructions      Discharge Orders   Future Orders Complete By Expires     Diet general  As directed     Discharge instructions  As directed     Comments:      Follow up with GYN as soon as possible. Follow up with ANDY,CAMILLE L, MD in 1 week.    Increase activity slowly  As directed         Medication List    STOP taking these medications       HYDROcodone-acetaminophen 5-325 MG per tablet  Commonly known as:  NORCO/VICODIN      TAKE these medications       diclofenac 75 MG EC tablet  Commonly known as:  VOLTAREN  Take 75 mg by mouth 2 (two) times daily.     docusate sodium 100 MG capsule  Commonly known as:  COLACE  Take 1 capsule (100 mg total) by mouth 2 (two) times daily. Take as long as you are on narcotic pain medications.     FLUoxetine 20 MG capsule  Commonly known as:  PROZAC  Take 20 mg by mouth every morning.     montelukast 10 MG tablet  Commonly known as:  SINGULAIR  Take 10 mg by mouth at bedtime.     ondansetron 4 MG tablet  Commonly known as:  ZOFRAN  Take 2 tablets (8 mg total) by mouth every 6 (six) hours as needed for nausea.     oxyCODONE 5 MG immediate release tablet  Commonly known as:  Oxy IR/ROXICODONE  Take 1 tablet (5 mg total) by mouth every 6 (six) hours as needed. Take 1-2 tablets every 4 hours as needed for pain.     XOLAIR [place]  Inject 1 each into the skin every 14 (fourteen) days. Asthma injection gets at Dr Lucie Leather office       Follow-up Information   Follow up with ANDY,CAMILLE L, MD. Schedule an appointment as soon as possible for a visit in 1  week.   Contact information:   [address] [place] [address] [phone]       Please follow up. (Patient to call to set up appointment with GYN as soon as possible.)        The results of significant diagnostics from this hospitalization (including imaging, microbiology, ancillary and laboratory) are listed below for reference.    Significant Diagnostic Studies: US Abdomen Complete  12/30/2012  *RADIOLOGY REPORT*  Clinical Data:  Right upper quadrant abdominal pain for 2 days.  ABDOMINAL ULTRASOUND COMPLETE  Comparison:  CT of the abdomen and pelvis performed 12/29/2012  Findings:  Gallbladder:  The gallbladder is normal in appearance, without evidence for gallstones, gallbladder wall thickening or pericholecystic fluid.  No ultrasonographic Murphy's sign is elicited.  Common  Bile Duct:  0.4 cm in diameter; within normal limits in caliber.  Liver:  Normal parenchymal echogenicity and echotexture; no focal lesions identified.  Limited Doppler evaluation demonstrates normal blood flow within the liver.  IVC:  Unremarkable in appearance.  Pancreas:  Although the pancreas is difficult to visualize in its entirety due to overlying bowel gas, no focal pancreatic abnormality is identified.  Spleen:  7.8 cm in length; within normal limits in size and echotexture.  Right kidney:  10.7 cm in length; normal in size, configuration and parenchymal echogenicity.  No evidence of mass or hydronephrosis. Trace free fluid is noted tracking about the inferior pole of the right kidney; this is also characterized on the recent CT.  Left kidney:  11.1 cm in length; normal in size, configuration and parenchymal echogenicity.  No evidence of mass or hydronephrosis.  Abdominal Aorta:  Normal in caliber; no aneurysm identified.  IMPRESSION:  1.  Unremarkable abdominal ultrasound. 2.  Trace free fluid noted tracking about the inferior pole of the right kidney; this is also present on the recent CT, and is of  uncertain significance.  No definite evidence for pyelonephritis; no evidence of obstruction.   Original Report Authenticated By: Tonia Ghent, M.D.    Ct Abdomen Pelvis W Contrast  12/29/2012  *RADIOLOGY REPORT*  Clinical Data: Right-sided abdomen and right flank pain, some nausea and vomiting  CT ABDOMEN AND PELVIS WITH CONTRAST  Technique:  Multidetector CT imaging of the abdomen and pelvis was performed following the standard protocol during bolus administration of intravenous contrast.  Contrast: OMNIPAQUE IOHEXOL 300 MG/ML  SOLN  Comparison: None.  Findings: The lung bases are clear.  The liver enhances with no focal abnormality and no ductal dilatation is seen.  No calcified gallstones are noted.  The pancreas is normal in size and the pancreatic duct is not dilated.  The adrenal glands and spleen are unremarkable.  The stomach is decompressed.  The kidneys enhance with no calculus or mass and no hydronephrosis is seen.  The abdominal aorta is normal in caliber.  No adenopathy is seen.  The uterus is anteverted and unremarkable.  Probable small ovarian follicles are noted and there appears to be a collapsing cyst anteriorly on the left of 2.2 cm with a small amount of free fluid adjacent.  The urinary bladder is decompressed.  No abnormality of the colon is seen.  The appendix is moderately well visualized, and portions of the appendix fill with air, with no evidence of an inflammatory process noted.  The terminal ileum is unremarkable. Mild lumbar curvature convex to the left is noted.  The lumbar vertebrae are in normal alignment with normal disc spaces.  IMPRESSION:  1.  Probable collapsing left ovarian cyst possibly a corpus luteum cyst, with a small amount of free fluid adjacent. 2.  The appendix is well seen and appears normal, and the terminal ileum is normal.   Original Report Authenticated By: Dwyane Dee, M.D.     Microbiology: Recent Results (from the past 240 hour(s))  URINE CULTURE      Status: None   Collection Time    12/29/12  7:57 AM      Result Value Range Status   Specimen Description URINE, CLEAN CATCH   Final   Special Requests NONE   Final   Culture  Setup Time 12/29/2012 15:23   Final   Colony Count NO GROWTH   Final   Culture NO GROWTH   Final   Report  Status 12/30/2012 FINAL   Final     Labs: Basic Metabolic Panel:  Recent Labs Lab 12/29/12 0815 12/30/12 2015 12/31/12 0425 01/02/13 0417  NA 135 133* 138 134*  K 3.5 3.4* 3.5 3.0*  CL 99 99 106 102  CO2 22 23 22 22   GLUCOSE 82 82 96 81  BUN 12 14 12 7   CREATININE 0.84 1.61* 1.45* 0.87  CALCIUM 9.7 9.3 8.1* 8.4  MG  --   --   --  1.6   Liver Function Tests:  Recent Labs Lab 12/29/12 0815 12/30/12 2015  AST 12 11  ALT 5 6  ALKPHOS 58 55  BILITOT 0.9 0.6  PROT 8.3 7.7  ALBUMIN 4.4 4.0    Recent Labs Lab 12/29/12 0815 12/30/12 2015  LIPASE 18 16   No results found for this basename: AMMONIA,  in the last 168 hours CBC:  Recent Labs Lab 12/29/12 0815 12/30/12 2015 12/31/12 0425 01/01/13 0445  WBC 12.6* 14.2* 11.3* 8.2  NEUTROABS 10.7* 11.7*  --   --   HGB 12.7 11.9* 10.4* 9.7*  HCT 38.7 36.9 32.9* 30.3*  MCV 86.2 86.6 88.0 88.1  PLT 244 234 208 179   Cardiac Enzymes: No results found for this basename: CKTOTAL, CKMB, CKMBINDEX, TROPONINI,  in the last 168 hours BNP: BNP (last 3 results) No results found for this basename: PROBNP,  in the last 8760 hours CBG: No results found for this basename: GLUCAP,  in the last 168 hours     Signed:  Anaisha Mago  [facility] 01/02/2013, 1:21 PM

## 2013-04-10 ENCOUNTER — Other Ambulatory Visit: Payer: Self-pay | Admitting: Orthopedic Surgery

## 2013-04-10 DIAGNOSIS — M545 Low back pain: Secondary | ICD-10-CM

## 2013-04-14 ENCOUNTER — Ambulatory Visit
Admission: RE | Admit: 2013-04-14 | Discharge: 2013-04-14 | Disposition: A | Discharge status: Home / Self Care | Payer: Medicaid Other | Source: Ambulatory Visit | Attending: Orthopedic Surgery | Admitting: Orthopedic Surgery

## 2013-04-14 DIAGNOSIS — M545 Low back pain: Secondary | ICD-10-CM

## 2013-05-02 ENCOUNTER — Ambulatory Visit: Payer: Self-pay | Admitting: Obstetrics

## 2013-05-16 ENCOUNTER — Ambulatory Visit: Payer: Medicaid Other | Admitting: Obstetrics

## 2013-06-06 ENCOUNTER — Ambulatory Visit: Payer: Medicaid Other | Admitting: Physical Therapy

## 2013-06-11 ENCOUNTER — Ambulatory Visit: Payer: Medicaid Other | Attending: Physical Medicine and Rehabilitation | Admitting: Physical Therapy

## 2013-07-20 ENCOUNTER — Emergency Department (HOSPITAL_COMMUNITY)
Admission: EM | Admit: 2013-07-20 | Discharge: 2013-07-20 | Disposition: A | Discharge status: Home / Self Care | Payer: Medicaid Other | Source: Home / Self Care | Attending: Emergency Medicine | Admitting: Emergency Medicine

## 2013-07-20 ENCOUNTER — Emergency Department (INDEPENDENT_AMBULATORY_CARE_PROVIDER_SITE_OTHER): Payer: Medicaid Other

## 2013-07-20 ENCOUNTER — Encounter (HOSPITAL_COMMUNITY): Payer: Self-pay

## 2013-07-20 DIAGNOSIS — J209 Acute bronchitis, unspecified: Secondary | ICD-10-CM

## 2013-07-20 MED ORDER — LEVOFLOXACIN 500 MG PO TABS: 750.0000 mg | ORAL_TABLET | Freq: Every day | ORAL | Status: DC

## 2013-07-20 NOTE — ED Provider Notes (Signed)
CSN: [id number]     Arrival date & time 07/20/13  1805 History   First MD Initiated Contact with Patient 07/20/13 1913     Chief Complaint  Patient presents with  . Fever   (Consider location/radiation/quality/duration/timing/severity/associated sxs/prior Treatment) The history is provided by the patient. No language interpreter was used.  C/O COUGH, FEVER X 1 WEEK  ON AMOXICILLIN COUGH MOSTLY DRY FEVER INTERMITTENT  Past Medical History  Diagnosis Date  . Eczema   . Asthma   . Bronchitis   . Chronic back pain    Past Surgical History  Procedure Laterality Date  . Leg surgery      tib fib left?   Family History  Problem Relation Age of Onset  . Macular degeneration Mother    History  Substance Use Topics  . Smoking status: Never Smoker   . Smokeless tobacco: Not on file  . Alcohol Use: No   OB History   Grav Para Term Preterm Abortions TAB SAB Ect Mult Living                 Review of Systems  Constitutional: Positive for fever.  HENT: Negative.   Eyes: Negative.   Respiratory: Positive for cough and shortness of breath.   Cardiovascular: Negative.   Gastrointestinal: Negative.   Endocrine: Negative.   Genitourinary: Negative.   Musculoskeletal: Negative.   Neurological: Negative.   Hematological: Negative.   Psychiatric/Behavioral: Negative.   All other systems reviewed and are negative.    Allergies  Aspirin  Home Medications   Current Outpatient Rx  Name  Route  Sig  Dispense  Refill  . diclofenac (VOLTAREN) 75 MG EC tablet   Oral   Take 75 mg by mouth 2 (two) times daily.         Marland Kitchen docusate sodium (COLACE) 100 MG capsule   Oral   Take 1 capsule (100 mg total) by mouth 2 (two) times daily. Take as long as you are on narcotic pain medications.   10 capsule   0   . FLUoxetine (PROZAC) 20 MG capsule   Oral   Take 20 mg by mouth every morning.         Marland Kitchen levofloxacin (LEVAQUIN) 500 MG tablet   Oral   Take 1.5 tablets (750 mg total) by  mouth daily.   10 tablet   0   . montelukast (SINGULAIR) 10 MG tablet   Oral   Take 10 mg by mouth at bedtime.         . Omalizumab (XOLAIR [place])   Subcutaneous   Inject 1 each into the skin every 14 (fourteen) days. Asthma injection gets at Dr Lucie Leather office         . ondansetron (ZOFRAN) 4 MG tablet   Oral   Take 2 tablets (8 mg total) by mouth every 6 (six) hours as needed for nausea.   20 tablet   0   . oxyCODONE (OXY IR/ROXICODONE) 5 MG immediate release tablet   Oral   Take 1 tablet (5 mg total) by mouth every 6 (six) hours as needed. Take 1-2 tablets every 4 hours as needed for pain.   30 tablet   0    BP 114/75  Pulse 90  Temp(Src) 98.9 F (37.2 C) (Oral)  Resp 16  SpO2 98%  LMP 07/20/2013 Physical Exam  Nursing note and vitals reviewed. Constitutional: She is oriented to person, place, and time. She appears well-developed and well-nourished.  HENT:  Head:  Normocephalic and atraumatic.  Mouth/Throat: Oropharynx is clear and moist.  Eyes: Conjunctivae are normal. Pupils are equal, round, and reactive to light.  Neck: Normal range of motion. Neck supple.  Cardiovascular: Normal rate, regular rhythm, normal heart sounds and intact distal pulses.   No murmur heard. Pulmonary/Chest:  LUNGS=DIMINISHED BREATH SOUNDS BILATERAL NO RALE,NO WHEEZES  Abdominal: Soft. Bowel sounds are normal. She exhibits no distension and no mass. There is no tenderness.  Musculoskeletal: Normal range of motion.  Neurological: She is alert and oriented to person, place, and time. No cranial nerve deficit. She exhibits normal muscle tone. Coordination normal.  Skin: Skin is warm and dry.  Psychiatric: She has a normal mood and affect.    ED Course  Procedures (including critical care time) Labs Review Labs Reviewed - No data to display Imaging Review Dg Chest 2 View  07/20/2013   CLINICAL DATA:  Cough.  EXAM: CHEST  2 VIEW  COMPARISON:  06/27/2008  FINDINGS: The heart size and  mediastinal contours are within normal limits. Both lungs are clear. The visualized skeletal structures are unremarkable.  IMPRESSION: No active cardiopulmonary disease.   Electronically Signed   By: Charlett Nose   On: 07/20/2013 19:40    MDM   1. Acute bronchitis       Jani Files, MD 07/20/13 2019

## 2013-07-20 NOTE — ED Notes (Signed)
Cough, sinus congestion

## 2013-08-16 DIAGNOSIS — M545 Low back pain, unspecified: Secondary | ICD-10-CM | POA: Insufficient documentation

## 2013-08-16 DIAGNOSIS — G894 Chronic pain syndrome: Secondary | ICD-10-CM | POA: Insufficient documentation

## 2013-08-18 ENCOUNTER — Ambulatory Visit (HOSPITAL_COMMUNITY)
Admission: RE | Admit: 2013-08-18 | Discharge: 2013-08-18 | Disposition: A | Discharge status: Home / Self Care | Payer: Medicaid Other | Attending: Psychiatry | Admitting: Psychiatry

## 2013-08-18 NOTE — BH Assessment (Signed)
Tele Assessment Note   Danielle Bass is an 37 y.o. female. She presents as a walk in at the [facility] due to increased depressive symptoms. She is tearful at times when she is talking. She states that she has multiple stressors occurring in her life, including financial issues, medical issues, and stressors in the home caring for her children. She reports that she has multiple medical issues: severe allergies which impact her quality of life. She also describes a pain which radiates down her leg which has been impacting her quality of life as well. She reports that she filed for SSA disability a few years ago, and appealed the initial decision. She states she has minimal income coming into the home and can't understand why it is taking so long to hear about her disability claim. Additionally, she is not getting child support for her first two children and only receives minimal child support on her other two children. She has requested an increase to assist with her expenses, however, he is not cooperating with the court's request and this is causing additional stress on her. She reports she has been more irritable recently. She is only sleeping 4-5 hours a night, up pacing, mainly due to the pain she is experiencing in her leg. She states he appetite is fair and she doesn't know if she has lost weight. Her PCP is prescribing her 20 mg Prozac to help her with her depression, but has also referred her to [facility]. She states she went to [facility] but didn't like it and the therapist she used to see, Emilee Hero, didn't really help her. She is desiring to see a therapist and a psychiatrist in one practice. She denies feeling suicidal or homicidal at this time. No delusions or hallucinations noted. She states she has been more irritable, but mainly as it relates to how people treat her. She states that individuals try to get money from her, etc. And when she tells them she doesn't have it, they get upset with her. She  says many of these folks can work but don't manage their funds well. She also states her father speaks to her in an unkind tone in front of her kids, and this is leading to her children also not talking to her in a respectful manner. She describes feeling helpless to the circumstances in her life, but basically just needs one break to help her get back on her feet. She does contract for safety. Made a plan with the patient to add in mild exercise and time outside. Also gave her information on how to be more empowered to handle demanding individuals and to practice these coping strategies in an effort to feel empowered. Also gave her a listing of area counseling and psychiatry sources as she does not want to go to [place]. Patient continued to deny SI and HI, contracted for safety. She signed the MSE and no harm contract.   Axis I: Major Depression, Recurrent severe Axis II: Deferred  Axis III: See Past Medical History Axis IV: Stressors: financial due to insufficient income and pending disability; unsupportive relationships; medical issues Axis V: GAF 68  Past Medical History:  Past Medical History  Diagnosis Date  . Eczema   . Asthma   . Bronchitis   . Chronic back pain     Past Surgical History  Procedure Laterality Date  . Leg surgery      tib fib left?    Family History:  Family History  Problem Relation  Age of Onset  . Macular degeneration Mother     Social History:  reports that she has never smoked. She does not have any smokeless tobacco history on file. She reports that she does not drink alcohol or use illicit drugs.  Additional Social History:     CIWA:   COWS:    Allergies:  Allergies  Allergen Reactions  . Aspirin Other (See Comments)    "doctor doesn't want her to take"    Home Medications:  (Not in a hospital admission)  OB/GYN Status:  Patient's last menstrual period was 07/20/2013.  General Assessment Data Location of Assessment: [facility] Assessment  Services ACT Assessment: Yes Is this a Tele or Face-to-Face Assessment?: Face-to-Face Is this an Initial Assessment or a Re-assessment for this encounter?: Initial Assessment Living Arrangements: Children Can pt return to current living arrangement?: Yes Admission Status: Voluntary Is patient capable of signing voluntary admission?: Yes Referral Source: Self/Family/Friend  Medical Screening Exam [facility] Walk-in ONLY) Medical Exam completed: No Reason for MSE not completed: Patient Refused  [facility] Crisis Care Plan Living Arrangements: Children  Education Status Is patient currently in school?: No Highest grade of school patient has completed:  (14)  Risk to self Suicidal Ideation: No Suicidal Intent: No Is patient at risk for suicide?: No Suicidal Plan?: No Access to Means: No What has been your use of drugs/alcohol within the last 12 months?:  (denies) Previous Attempts/Gestures: No Intentional Self Injurious Behavior: None Family Suicide History: No Recent stressful life event(s): Conflict (Comment);Job Loss;Financial Problems;Recent negative physical changes Persecutory voices/beliefs?: No Depression: Yes Depression Symptoms: Despondent;Tearfulness;Isolating;Loss of interest in usual pleasures;Feeling worthless/self pity Substance abuse history and/or treatment for substance abuse?: No Suicide prevention information given to non-admitted patients: Yes  Risk to Others Homicidal Ideation: No Thoughts of Harm to Others: No Current Homicidal Intent: No Current Homicidal Plan: No Access to Homicidal Means: No History of harm to others?: No Assessment of Violence: None Noted Does patient have access to weapons?: No Criminal Charges Pending?: No Does patient have a court date: No  Psychosis Hallucinations: None noted Delusions: None noted  Mental Status Report Appear/Hygiene: Other (Comment) (appropriate) Eye Contact: Good Motor Activity: Unremarkable Speech:  Logical/coherent;Soft Level of Consciousness: Alert;Quiet/awake;Crying Mood: Depressed;Preoccupied Affect: Anxious;Appropriate to circumstance;Depressed Anxiety Level: Minimal Thought Processes: Relevant Judgement: Unimpaired Orientation: Person;Place;Time;Situation;Appropriate for developmental age Obsessive Compulsive Thoughts/Behaviors: None  Cognitive Functioning Concentration: Decreased Memory: Recent Intact;Remote Intact IQ: Average Insight: Good Impulse Control: Good Appetite: Fair Sleep: Decreased Total Hours of Sleep:  (5) Vegetative Symptoms: None  ADLScreening [facility] Assessment Services) Patient's cognitive ability adequate to safely complete daily activities?: Yes Patient able to express need for assistance with ADLs?: Yes Independently performs ADLs?: Yes (appropriate for developmental age)  Prior Inpatient Therapy Prior Inpatient Therapy: No  Prior Outpatient Therapy Prior Outpatient Therapy: No  ADL Screening (condition at time of admission) Patient's cognitive ability adequate to safely complete daily activities?: Yes Patient able to express need for assistance with ADLs?: Yes Independently performs ADLs?: Yes (appropriate for developmental age)                  Additional Information 1:1 In Past 12 Months?: No CIRT Risk: No Elopement Risk: No Does patient have medical clearance?: Yes     Disposition:  Disposition Initial Assessment Completed for this Encounter: Yes Disposition of Patient: Outpatient treatment Type of outpatient treatment: Adult  Mardene Celeste 08/18/2013 1:08 PM

## 2013-09-19 DIAGNOSIS — G8921 Chronic pain due to trauma: Secondary | ICD-10-CM | POA: Insufficient documentation

## 2013-09-19 DIAGNOSIS — M5410 Radiculopathy, site unspecified: Secondary | ICD-10-CM | POA: Insufficient documentation

## 2013-10-02 ENCOUNTER — Encounter (HOSPITAL_COMMUNITY): Payer: Self-pay | Admitting: Emergency Medicine

## 2013-10-02 ENCOUNTER — Emergency Department (INDEPENDENT_AMBULATORY_CARE_PROVIDER_SITE_OTHER): Payer: Medicaid Other

## 2013-10-02 ENCOUNTER — Emergency Department (HOSPITAL_COMMUNITY)
Admission: EM | Admit: 2013-10-02 | Discharge: 2013-10-02 | Disposition: A | Discharge status: Nursing Home (Includes ICF) | Payer: Medicaid Other | Source: Home / Self Care | Attending: Family Medicine | Admitting: Family Medicine

## 2013-10-02 ENCOUNTER — Emergency Department (HOSPITAL_COMMUNITY)
Admission: EM | Admit: 2013-10-02 | Discharge: 2013-10-02 | Disposition: A | Discharge status: Home / Self Care | Payer: Medicaid Other | Attending: Emergency Medicine | Admitting: Emergency Medicine

## 2013-10-02 DIAGNOSIS — R071 Chest pain on breathing: Secondary | ICD-10-CM

## 2013-10-02 DIAGNOSIS — R0789 Other chest pain: Secondary | ICD-10-CM

## 2013-10-02 DIAGNOSIS — M79609 Pain in unspecified limb: Secondary | ICD-10-CM | POA: Insufficient documentation

## 2013-10-02 DIAGNOSIS — F329 Major depressive disorder, single episode, unspecified: Secondary | ICD-10-CM | POA: Insufficient documentation

## 2013-10-02 DIAGNOSIS — Z79899 Other long term (current) drug therapy: Secondary | ICD-10-CM | POA: Insufficient documentation

## 2013-10-02 DIAGNOSIS — F3289 Other specified depressive episodes: Secondary | ICD-10-CM | POA: Insufficient documentation

## 2013-10-02 DIAGNOSIS — Z8719 Personal history of other diseases of the digestive system: Secondary | ICD-10-CM | POA: Insufficient documentation

## 2013-10-02 DIAGNOSIS — M549 Dorsalgia, unspecified: Secondary | ICD-10-CM | POA: Insufficient documentation

## 2013-10-02 DIAGNOSIS — Z9889 Other specified postprocedural states: Secondary | ICD-10-CM | POA: Insufficient documentation

## 2013-10-02 DIAGNOSIS — G8929 Other chronic pain: Secondary | ICD-10-CM | POA: Insufficient documentation

## 2013-10-02 DIAGNOSIS — J45909 Unspecified asthma, uncomplicated: Secondary | ICD-10-CM | POA: Insufficient documentation

## 2013-10-02 HISTORY — DX: Gastro-esophageal reflux disease without esophagitis: K21.9

## 2013-10-02 HISTORY — DX: Depression, unspecified: F32.A

## 2013-10-02 HISTORY — DX: Major depressive disorder, single episode, unspecified: F32.9

## 2013-10-02 LAB — COMPREHENSIVE METABOLIC PANEL
Albumin: 4.3 g/dL (ref 3.5–5.2)
Alkaline Phosphatase: 42 U/L (ref 39–117)
BUN: 9 mg/dL (ref 6–23)
Creatinine, Ser: 0.77 mg/dL (ref 0.50–1.10)
Potassium: 3.5 mEq/L (ref 3.5–5.1)
Total Protein: 7.9 g/dL (ref 6.0–8.3)

## 2013-10-02 LAB — POCT I-STAT TROPONIN I: Troponin i, poc: 0 ng/mL (ref 0.00–0.08)

## 2013-10-02 LAB — D-DIMER, QUANTITATIVE: D-Dimer, Quant: 0.38 ug/mL-FEU (ref 0.00–0.48)

## 2013-10-02 LAB — CBC
HCT: 36.6 % (ref 36.0–46.0)
MCHC: 32.5 g/dL (ref 30.0–36.0)
RDW: 13.1 % (ref 11.5–15.5)

## 2013-10-02 MED ORDER — IPRATROPIUM BROMIDE 0.02 % IN SOLN
0.5000 mg | Freq: Once | RESPIRATORY_TRACT | Status: AC
  Administered 2013-10-02: 0.5 mg via RESPIRATORY_TRACT

## 2013-10-02 MED ORDER — ALBUTEROL SULFATE (5 MG/ML) 0.5% IN NEBU
INHALATION_SOLUTION | RESPIRATORY_TRACT | Status: AC
  Filled 2013-10-02: qty 1

## 2013-10-02 MED ORDER — IBUPROFEN 800 MG PO TABS: 800.0000 mg | ORAL_TABLET | Freq: Three times a day (TID) | ORAL | Status: DC

## 2013-10-02 MED ORDER — IPRATROPIUM BROMIDE 0.02 % IN SOLN
RESPIRATORY_TRACT | Status: AC
  Filled 2013-10-02: qty 2.5

## 2013-10-02 MED ORDER — GI COCKTAIL ~~LOC~~
30.0000 mL | Freq: Once | ORAL | Status: AC
  Administered 2013-10-02: 30 mL via ORAL

## 2013-10-02 MED ORDER — KETOROLAC TROMETHAMINE 30 MG/ML IJ SOLN
30.0000 mg | Freq: Once | INTRAMUSCULAR | Status: AC
  Administered 2013-10-02: 30 mg via INTRAVENOUS
  Filled 2013-10-02: qty 1

## 2013-10-02 MED ORDER — MORPHINE SULFATE 4 MG/ML IJ SOLN
4.0000 mg | Freq: Once | INTRAMUSCULAR | Status: AC
  Administered 2013-10-02: 4 mg via INTRAVENOUS
  Filled 2013-10-02: qty 1

## 2013-10-02 MED ORDER — OXYCODONE-ACETAMINOPHEN 5-325 MG PO TABS: 2.0000 | ORAL_TABLET | Freq: Four times a day (QID) | ORAL | Status: DC | PRN

## 2013-10-02 MED ORDER — ALBUTEROL SULFATE (5 MG/ML) 0.5% IN NEBU
5.0000 mg | INHALATION_SOLUTION | Freq: Once | RESPIRATORY_TRACT | Status: AC
Start: 2013-10-02 — End: 2013-10-02
  Administered 2013-10-02: 5 mg via RESPIRATORY_TRACT

## 2013-10-02 MED ORDER — GI COCKTAIL ~~LOC~~
ORAL | Status: AC
  Filled 2013-10-02: qty 30

## 2013-10-02 NOTE — ED Provider Notes (Signed)
Danielle Bass is a 37 y.o. female who presents to Urgent Care today for sudden onset of left-sided chest and left arm pain. This started this morning without any injury. He notes the pain is moderate to severe. The pain is worse with deep inspiration and left arm motion. She denies any pain radiating other than her left arm. She denies any palpitations or shortness of breath. She denies any syncope nausea vomiting or diarrhea.  She denies any recent immobility, leg swelling, or history of DVT.     She does have a history significant for multiple sources of chronic pain. She has had sudden onset of leg and back pain before.    Past Medical History  Diagnosis Date  . Eczema   . Asthma   . Bronchitis   . Chronic back pain    History  Substance Use Topics  . Smoking status: Never Smoker   . Smokeless tobacco: Not on file  . Alcohol Use: No   ROS as above Medications reviewed. No current facility-administered medications for this encounter.   Current Outpatient Prescriptions  Medication Sig Dispense Refill  . diclofenac (VOLTAREN) 75 MG EC tablet Take 75 mg by mouth 2 (two) times daily.      Marland Kitchen docusate sodium (COLACE) 100 MG capsule Take 1 capsule (100 mg total) by mouth 2 (two) times daily. Take as long as you are on narcotic pain medications.  10 capsule  0  . FLUoxetine (PROZAC) 20 MG capsule Take 20 mg by mouth every morning.      Marland Kitchen levofloxacin (LEVAQUIN) 500 MG tablet Take 1.5 tablets (750 mg total) by mouth daily.  10 tablet  0  . montelukast (SINGULAIR) 10 MG tablet Take 10 mg by mouth at bedtime.      . Omalizumab (XOLAIR [place]) Inject 1 each into the skin every 14 (fourteen) days. Asthma injection gets at Dr Danielle Bass office      . ondansetron (ZOFRAN) 4 MG tablet Take 2 tablets (8 mg total) by mouth every 6 (six) hours as needed for nausea.  20 tablet  0  . oxyCODONE (OXY IR/ROXICODONE) 5 MG immediate release tablet Take 1 tablet (5 mg total) by mouth every 6 (six) hours as needed.  Take 1-2 tablets every 4 hours as needed for pain.  30 tablet  0    Exam:  BP 129/93  Pulse 78  Temp(Src) 98.1 F (36.7 C) (Oral)  Resp 16  SpO2 100%  LMP 10/02/2013 Gen: Well NAD HEENT: EOMI,  MMM Lungs: CTABL Nl WOB Heart: RRR no MRG Chest wall: Tender to palpation left anterior chest wall. Significant chest wall pain with arm abduction.  Abd: NABS, NT, ND Exts: Non edematous BL  LE, warm and well perfused.  Left shoulder: Normal-appearing. Mildly tender Normal range of motion but pain with abduction. Normal external and internal rotation. Negative impingement testing.  Patient was given a GI cocktail and DuoNeb nebulizer treatment and had no benefit to her  Twelve-lead EKG shows normal sinus rhythm at 74 beats per minute. No significant abnormalities. This is not significantly changed from prior EKG September 2012  No results found for this or any previous visit (from the past 24 hour(s)). Dg Chest 2 View  10/02/2013   CLINICAL DATA:  Chest pain.  EXAM: CHEST  2 VIEW  COMPARISON:  July 20, 2013.  FINDINGS: The heart size and mediastinal contours are within normal limits. Both lungs are clear. The visualized skeletal structures are unremarkable.  IMPRESSION: No  active cardiopulmonary disease.   Electronically Signed   By: Danielle Bass M.D.   On: 10/02/2013 09:40    Assessment and Plan: 37 y.o. female with left chest wall pain.  Patient most likely has costochondritis as the source of her pain. However she continues to be in pain despite GI cocktail and DuoNeb nebulizer treatment.   I believe the patient would benefit from further evaluation in the emergency room. She likely needs a d-dimer, and cardiac enzymes. Plan to transfer via the shuttle further evaluation and management.       Danielle Bong, MD 10/02/13 [phone]

## 2013-10-02 NOTE — ED Provider Notes (Signed)
CSN: [id number]     Arrival date & time 10/02/13  1030 History   First MD Initiated Contact with Patient 10/02/13 1043     Chief Complaint  Patient presents with  . Chest Pain   (Consider location/radiation/quality/duration/timing/severity/associated sxs/prior Treatment) HPI Comments: Patient presents to the emergency department with chief complaint of left-sided chest and left arm pain. She states pain began this morning at around 7:00. She has not had chest pain like this before. She denies any mechanism of injury. She states pain is moderate to severe. States that it is worsened with breathing and with left arm movement. She denies feeling short of breath. Denies fevers, chills, cough, nausea, vomiting, or diarrhea. States that she was evaluated for DVT in the past, but was never found to have one. Patient has chronic pain from a traumatic leg injury in 2009.  The history is provided by the patient. No language interpreter was used.    Past Medical History  Diagnosis Date  . Eczema   . Asthma   . Bronchitis   . Chronic back pain   . Depression   . Acid reflux    Past Surgical History  Procedure Laterality Date  . Leg surgery      tib fib left?   Family History  Problem Relation Age of Onset  . Macular degeneration Mother    History  Substance Use Topics  . Smoking status: Never Smoker   . Smokeless tobacco: Not on file  . Alcohol Use: No   OB History   Grav Para Term Preterm Abortions TAB SAB Ect Mult Living                 Review of Systems  All other systems reviewed and are negative.    Allergies  Aspirin and Other  Home Medications   Current Outpatient Rx  Name  Route  Sig  Dispense  Refill  . diphenhydrAMINE (SOMINEX) 25 MG tablet   Oral   Take 25 mg by mouth daily as needed for allergies or sleep.         Marland Kitchen FLUoxetine (PROZAC) 20 MG capsule   Oral   Take 20 mg by mouth every morning.         . montelukast (SINGULAIR) 10 MG tablet   Oral  Take 10 mg by mouth at bedtime.         . Omalizumab (XOLAIR [place])   Subcutaneous   Inject 1 each into the skin every 14 (fourteen) days. Asthma injection gets at Dr Lucie Leather office         . traMADol (ULTRAM) 50 MG tablet   Oral   Take 50 mg by mouth daily as needed for moderate pain.          BP 132/77  Pulse 71  Temp(Src) 97.7 F (36.5 C)  Resp 19  SpO2 100%  LMP 10/02/2013 Physical Exam  Nursing note and vitals reviewed. Constitutional: She is oriented to person, place, and time. She appears well-developed and well-nourished.  HENT:  Head: Normocephalic and atraumatic.  Eyes: Conjunctivae and EOM are normal. Pupils are equal, round, and reactive to light.  Neck: Normal range of motion. Neck supple.  Cardiovascular: Normal rate, regular rhythm and intact distal pulses.  Exam reveals no gallop and no friction rub.   No murmur heard. Intact distal pulses with brisk capillary refill  Pulmonary/Chest: Effort normal and breath sounds normal. No respiratory distress. She has no wheezes. She has no rales. She exhibits  no tenderness.  Left chest wall tenderness palpation  Abdominal: Soft. She exhibits no distension and no mass. There is no tenderness. There is no rebound and no guarding.  Musculoskeletal: Normal range of motion. She exhibits no edema and no tenderness.  Left upper arm tender to palpation, but no swelling, no erythema  Neurological: She is alert and oriented to person, place, and time.  Skin: Skin is warm and dry.  Psychiatric: She has a normal mood and affect. Her behavior is normal. Judgment and thought content normal.    ED Course  Procedures (including critical care time)  Results for orders placed during the hospital encounter of 10/02/13  CBC      Result Value Range   WBC 7.5  4.0 - 10.5 K/uL   RBC 4.20  3.87 - 5.11 MIL/uL   Hemoglobin 11.9 (*) 12.0 - 15.0 g/dL   HCT 16.1  09.6 - 04.5 %   MCV 87.1  78.0 - 100.0 fL   MCH 28.3  26.0 - 34.0 pg   MCHC  32.5  30.0 - 36.0 g/dL   RDW 40.9  81.1 - 91.4 %   Platelets 220  150 - 400 K/uL  COMPREHENSIVE METABOLIC PANEL      Result Value Range   Sodium 138  135 - 145 mEq/L   Potassium 3.5  3.5 - 5.1 mEq/L   Chloride 101  96 - 112 mEq/L   CO2 25  19 - 32 mEq/L   Glucose, Bld 82  70 - 99 mg/dL   BUN 9  6 - 23 mg/dL   Creatinine, Ser 7.82  0.50 - 1.10 mg/dL   Calcium 9.3  8.4 - 95.6 mg/dL   Total Protein 7.9  6.0 - 8.3 g/dL   Albumin 4.3  3.5 - 5.2 g/dL   AST 13  0 - 37 U/L   ALT 5  0 - 35 U/L   Alkaline Phosphatase 42  39 - 117 U/L   Total Bilirubin 0.5  0.3 - 1.2 mg/dL   GFR calc non Af Amer >90  >90 mL/min   GFR calc Af Amer >90  >90 mL/min  D-DIMER, QUANTITATIVE      Result Value Range   D-Dimer, Quant 0.38  0.00 - 0.48 ug/mL-FEU  POCT I-STAT TROPONIN I      Result Value Range   Troponin i, poc 0.00  0.00 - 0.08 ng/mL   Comment 3            Dg Chest 2 View  10/02/2013   CLINICAL DATA:  Chest pain.  EXAM: CHEST  2 VIEW  COMPARISON:  July 20, 2013.  FINDINGS: The heart size and mediastinal contours are within normal limits. Both lungs are clear. The visualized skeletal structures are unremarkable.  IMPRESSION: No active cardiopulmonary disease.   Electronically Signed   By: Roque Lias M.D.   On: 10/02/2013 09:40     Imaging Review Dg Chest 2 View  10/02/2013   CLINICAL DATA:  Chest pain.  EXAM: CHEST  2 VIEW  COMPARISON:  July 20, 2013.  FINDINGS: The heart size and mediastinal contours are within normal limits. Both lungs are clear. The visualized skeletal structures are unremarkable.  IMPRESSION: No active cardiopulmonary disease.   Electronically Signed   By: Roque Lias M.D.   On: 10/02/2013 09:40    EKG Interpretation   None       MDM   1. Chest wall pain     Patient with  CP which is pleuritic, also with reproducible tenderness with palpation.  Will treat pain, check labs, and re-evaluate.  Low risk for ACS, patient is PERC negative.  Cardiac enzymes are  negative, d-dimer is negative, heart score is 0, I suspect that this is musculoskeletal, possibly costochondritis. Patient is improved with Toradol. Will discharge to home with some pain medicine. Recommend primary care followup. Patient understands and agrees with the plan. She is stable and ready for discharge.  Patient discussed with Dr. Wilkie Aye, who agrees with the plan.    Roxy Horseman, PA-C 10/02/13 1357

## 2013-10-02 NOTE — ED Notes (Signed)
sudden onset of cp this am went to ucc  Had neg ekg and was sent here for further tests is not  On any BC had shot yesterday for allergies pt states it hurts to take a deep breath  Denies cardiac hx

## 2013-10-02 NOTE — ED Notes (Signed)
Pt  Reports        Chest  Pain       Which  Is   Worse  When    She takes  Deep  Breath  And  Pain is  Worse  On  Certain movements  And           She  Reports    Symptoms  Began this  Am   She  Reports  Under   Stress  As  Well

## 2013-10-03 NOTE — ED Provider Notes (Signed)
Medical screening examination/treatment/procedure(s) were performed by non-physician practitioner and as supervising physician I was immediately available for consultation/collaboration.  EKG Interpretation     Ventricular Rate:  81 PR Interval:  174 QRS Duration: 82 QT Interval:  378 QTC Calculation: 439 R Axis:   73 Text Interpretation:  Normal sinus rhythm Nonspecific T wave abnormality Abnormal ECG No significant change since last tracing              Shon Baton, MD 10/03/13 0710

## 2013-11-06 ENCOUNTER — Other Ambulatory Visit: Payer: Self-pay

## 2013-11-20 DIAGNOSIS — M533 Sacrococcygeal disorders, not elsewhere classified: Secondary | ICD-10-CM | POA: Insufficient documentation

## 2014-12-26 ENCOUNTER — Other Ambulatory Visit: Payer: Self-pay | Admitting: Emergency Medicine

## 2014-12-26 DIAGNOSIS — R1032 Left lower quadrant pain: Secondary | ICD-10-CM

## 2014-12-26 DIAGNOSIS — R112 Nausea with vomiting, unspecified: Secondary | ICD-10-CM

## 2015-01-01 ENCOUNTER — Ambulatory Visit
Admission: RE | Admit: 2015-01-01 | Discharge: 2015-01-01 | Disposition: A | Discharge status: Home / Self Care | Payer: Medicaid Other | Source: Ambulatory Visit | Attending: Emergency Medicine | Admitting: Emergency Medicine

## 2015-01-01 DIAGNOSIS — R1032 Left lower quadrant pain: Secondary | ICD-10-CM

## 2015-01-01 DIAGNOSIS — R112 Nausea with vomiting, unspecified: Secondary | ICD-10-CM

## 2015-01-01 MED ORDER — IOHEXOL 300 MG/ML  SOLN
100.0000 mL | Freq: Once | INTRAMUSCULAR | Status: AC | PRN
  Administered 2015-01-01: 100 mL via INTRAVENOUS

## 2015-07-25 DIAGNOSIS — J4550 Severe persistent asthma, uncomplicated: Secondary | ICD-10-CM | POA: Insufficient documentation

## 2015-07-25 DIAGNOSIS — K219 Gastro-esophageal reflux disease without esophagitis: Secondary | ICD-10-CM | POA: Insufficient documentation

## 2015-07-25 DIAGNOSIS — H1010 Acute atopic conjunctivitis, unspecified eye: Secondary | ICD-10-CM | POA: Insufficient documentation

## 2015-07-25 DIAGNOSIS — L209 Atopic dermatitis, unspecified: Secondary | ICD-10-CM

## 2015-07-25 DIAGNOSIS — L501 Idiopathic urticaria: Secondary | ICD-10-CM

## 2015-07-25 DIAGNOSIS — T7800XA Anaphylactic reaction due to unspecified food, initial encounter: Secondary | ICD-10-CM | POA: Insufficient documentation

## 2015-07-25 DIAGNOSIS — J309 Allergic rhinitis, unspecified: Secondary | ICD-10-CM | POA: Insufficient documentation

## 2015-07-31 ENCOUNTER — Other Ambulatory Visit: Payer: Self-pay

## 2015-07-31 MED ORDER — OMALIZUMAB 150 MG ~~LOC~~ SOLR
375.0000 mg | SUBCUTANEOUS | Status: DC
  Administered 2015-09-03 – 2016-05-31 (×16): 375 mg via SUBCUTANEOUS

## 2015-08-26 ENCOUNTER — Other Ambulatory Visit: Payer: Self-pay | Admitting: Allergy and Immunology

## 2015-08-26 DIAGNOSIS — J4540 Moderate persistent asthma, uncomplicated: Secondary | ICD-10-CM

## 2015-09-03 ENCOUNTER — Ambulatory Visit (INDEPENDENT_AMBULATORY_CARE_PROVIDER_SITE_OTHER): Payer: Medicaid Other | Admitting: Neurology

## 2015-09-03 DIAGNOSIS — J4540 Moderate persistent asthma, uncomplicated: Secondary | ICD-10-CM | POA: Diagnosis not present

## 2015-09-03 DIAGNOSIS — J45901 Unspecified asthma with (acute) exacerbation: Secondary | ICD-10-CM

## 2015-09-18 ENCOUNTER — Ambulatory Visit (INDEPENDENT_AMBULATORY_CARE_PROVIDER_SITE_OTHER): Payer: Medicaid Other

## 2015-09-18 DIAGNOSIS — J4540 Moderate persistent asthma, uncomplicated: Secondary | ICD-10-CM

## 2015-09-18 DIAGNOSIS — J4541 Moderate persistent asthma with (acute) exacerbation: Secondary | ICD-10-CM

## 2015-10-12 ENCOUNTER — Ambulatory Visit (INDEPENDENT_AMBULATORY_CARE_PROVIDER_SITE_OTHER): Payer: Medicaid Other | Admitting: *Deleted

## 2015-10-12 DIAGNOSIS — J4540 Moderate persistent asthma, uncomplicated: Secondary | ICD-10-CM

## 2015-10-26 ENCOUNTER — Ambulatory Visit (INDEPENDENT_AMBULATORY_CARE_PROVIDER_SITE_OTHER): Payer: Medicaid Other

## 2015-10-26 DIAGNOSIS — J4540 Moderate persistent asthma, uncomplicated: Secondary | ICD-10-CM

## 2015-11-20 ENCOUNTER — Ambulatory Visit (INDEPENDENT_AMBULATORY_CARE_PROVIDER_SITE_OTHER): Payer: Medicaid Other | Admitting: Neurology

## 2015-11-20 DIAGNOSIS — J4551 Severe persistent asthma with (acute) exacerbation: Secondary | ICD-10-CM

## 2015-11-20 DIAGNOSIS — J4540 Moderate persistent asthma, uncomplicated: Secondary | ICD-10-CM

## 2015-12-04 ENCOUNTER — Encounter: Payer: Self-pay | Admitting: Allergy and Immunology

## 2015-12-04 ENCOUNTER — Ambulatory Visit (INDEPENDENT_AMBULATORY_CARE_PROVIDER_SITE_OTHER): Payer: Medicaid Other | Admitting: Allergy and Immunology

## 2015-12-04 VITALS — BP 120/80 | HR 76 | Temp 98.2°F | Resp 18

## 2015-12-04 DIAGNOSIS — J309 Allergic rhinitis, unspecified: Secondary | ICD-10-CM | POA: Diagnosis not present

## 2015-12-04 DIAGNOSIS — H1010 Acute atopic conjunctivitis, unspecified eye: Secondary | ICD-10-CM | POA: Diagnosis not present

## 2015-12-04 DIAGNOSIS — J4550 Severe persistent asthma, uncomplicated: Secondary | ICD-10-CM | POA: Diagnosis not present

## 2015-12-04 DIAGNOSIS — L501 Idiopathic urticaria: Secondary | ICD-10-CM | POA: Diagnosis not present

## 2015-12-04 MED ORDER — PIMECROLIMUS 1 % EX CREA: TOPICAL_CREAM | CUTANEOUS | Status: DC

## 2015-12-04 MED ORDER — OLOPATADINE HCL 0.7 % OP SOLN: 1.0000 [drp] | Freq: Every day | OPHTHALMIC | Status: DC

## 2015-12-04 MED ORDER — ALBUTEROL SULFATE HFA 108 (90 BASE) MCG/ACT IN AERS: INHALATION_SPRAY | RESPIRATORY_TRACT | Status: DC

## 2015-12-04 MED ORDER — MOMETASONE FUROATE 0.1 % EX CREA: TOPICAL_CREAM | CUTANEOUS | Status: DC

## 2015-12-04 MED ORDER — BECLOMETHASONE DIPROPIONATE 80 MCG/ACT NA AERS: INHALATION_SPRAY | NASAL | Status: DC

## 2015-12-04 MED ORDER — MONTELUKAST SODIUM 10 MG PO TABS: ORAL_TABLET | ORAL | Status: DC

## 2015-12-04 NOTE — Progress Notes (Signed)
FOLLOW UP NOTE  RE: Danielle QuakerDenetra J Hyppolite MRN: [id number] DOB: 03/23/1976 [facility] [place] [address] [place] [id number] Date of Office Visit: 12/04/2015  Subjective:  Danielle Bass is a 40 y.o. female who presents today for Nasal Congestion; Urticaria; and Cough  Assessment:   1. Severe persistent asthma, on dual controller therapy and Xolair, controlled.   2.      Allergic rhinoconjunctivitis, intermittent symptoms, afebrile in no respiratory distress. 3.      History of chronic urticaria and atopic dermatitis. 4.      Complex medical history on multiple medication regime. 5.      Residence in [place], [place].  Plan:   Meds ordered this encounter  Medications  . albuterol (PROAIR HFA) 108 (90 Base) MCG/ACT inhaler    Sig: Use 2 puffs every 4 hours as needed for cough or wheeze.  May use 2 puffs 10-20 minutes prior to exercise.    Dispense:  1 Inhaler    Refill:  1  . Beclomethasone Dipropionate (QNASL) 80 MCG/ACT AERS    Sig: Use 1-2 sprays in each nostril once daily for stuffy nose or congestion.    Dispense:  1 Inhaler    Refill:  5    PLEASE HOLD UNTIL PATIENT ASK FOR REFILL.  Marland Kitchen. Olopatadine HCl (PAZEO) 0.7 % SOLN    Sig: Apply 1 drop to eye daily. As needed for itchy eyes.    Dispense:  2.5 mL    Refill:  5  . mometasone (ELOCON) 0.1 % cream    Sig: Apply 1-2 times daily to red rash areas at body as needed.  DO NOT APPLY TO FACE.    Dispense:  45 g    Refill:  5  . montelukast (SINGULAIR) 10 MG tablet    Sig: TAKE ONE EACH EVENING TO PREVENT COUGH OR WHEEZE.    Dispense:  34 tablet    Refill:  5  . pimecrolimus (ELIDEL) 1 % cream    Sig: Apply once daily to red rash areas as needed.    Dispense:  100 g    Refill:  3   Patient Instructions  1.  Xolair today and schedule next appointment. 2.  Use Qnasl or Omnaris 1-2 sprays each nostril once daily. 3.  Use Pazeo one drop each eye once daily as needed.   4.  Moisturize skin  consistently. 5.  Continue Dulera, Singulair and Zyrtec. 6.  As needed Pro Air, EpiPen, Elocon, Elidel, Benadryl and albuterol neb. 7.  Communicate with primary care physician and counselor about other medication refills. 8.  Samples given today of availability--given Kassi's significant medication number and need to fill non respiratory/skin medications.  9.  Follow-up in 4 months or sooner if needed.  HPI: Danielle Bass presents to the office with recent congestion, and itchy eyes.  Since her last visit in July she generally has done well without recurring difficulties or frequent albuterol use.  She has not had emergency department visits, only urgent care related to pain issues with prednisone at that time.  No antibiotics or other medical issues except difficulty communicating with new counselor in [place] related to medication refills.  She has not spoken with her primary care physician here.  She previously had an eyedrop and nose spray which are beneficial but does not have currently, for her symptoms over the last 2 weeks.  Initially, she had a mild cough, which resolved.  No wheezing, shortness of breath,  chest congestion, difficulty breathing or other new concerns.  Denies ED or urgent care visits, prednisone or antibiotic courses. Reports sleep and activity are normal.  Current Medications: 1.  Pro Air/EpiPen/Benadryl as needed. 2.  Zyrtec 10 mg daily. 3.  Dulera 2 puffs twice daily. 4.  Saline 10 mg daily. 5.  As needed Elidel and Elocon. 6.  Xolair. 7.  Typically, amitriptyline, Zantac, Latuda, Lyrica, and tramadol.  Drug Allergies: Allergies  Allergen Reactions  . Other Anaphylaxis    Tree nut, Tomato sauce  . Aspirin Other (See Comments)    "doctor doesn't want her to take"   Objective:   Filed Vitals:   12/04/15 1127  BP: 120/80  Pulse: 76  Temp: 98.2 F (36.8 C)  Resp: 18   SpO2 Readings from Last 1 Encounters:  12/04/15 99%   Physical Exam  Constitutional:  She is well-developed, well-nourished, and in no distress.  HENT:  Head: Atraumatic.  Right Ear: Tympanic membrane and ear canal normal.  Left Ear: Tympanic membrane and ear canal normal.  Nose: Mucosal edema present. No rhinorrhea. No epistaxis.  Mouth/Throat: Oropharynx is clear and moist and mucous membranes are normal. No oropharyngeal exudate, posterior oropharyngeal edema or posterior oropharyngeal erythema.  Eyes: Conjunctivae and EOM are normal. Pupils are equal, round, and reactive to light. Right eye exhibits no discharge. Left eye exhibits no discharge.  Neck: Neck supple.  Cardiovascular: Normal rate, S1 normal and S2 normal.   No murmur heard. Pulmonary/Chest: Effort normal. She has no wheezes. She has no rhonchi. She has no rales.  Lymphadenopathy:    She has no cervical adenopathy.  Skin: Skin is dry.  Noted Dennie Morgan's lines Significant xerosis and varying melanin changes, no hives.   Diagnostics: Spirometry FVC 2.94--100%, FEV1 2.61--105%.    Addyson Traub M. Willa Rough, MD  cc: Willow Ora, MD

## 2015-12-04 NOTE — Patient Instructions (Addendum)
  Xolair today and schedule next appointment.  Use Qnasl or Omnaris 1-2 sprays each nostril once daily.  Use Pazeo one drop each eye once daily as needed.    Moisturize skin consistently.  Continue Dulera, Singulair and Zyrtec.  As needed [employer] Air, EpiPen, Elocon, Elidel, Benadryl and albuterol neb.  Communicate with primary care physician and counselor about other medication refills.  Follow-up in 4 months or sooner if needed.

## 2015-12-16 ENCOUNTER — Ambulatory Visit (INDEPENDENT_AMBULATORY_CARE_PROVIDER_SITE_OTHER): Payer: Medicaid Other

## 2015-12-16 DIAGNOSIS — J4540 Moderate persistent asthma, uncomplicated: Secondary | ICD-10-CM

## 2016-01-12 ENCOUNTER — Ambulatory Visit (INDEPENDENT_AMBULATORY_CARE_PROVIDER_SITE_OTHER): Payer: Medicaid Other

## 2016-01-12 DIAGNOSIS — J4540 Moderate persistent asthma, uncomplicated: Secondary | ICD-10-CM | POA: Diagnosis not present

## 2016-01-29 ENCOUNTER — Ambulatory Visit (INDEPENDENT_AMBULATORY_CARE_PROVIDER_SITE_OTHER): Payer: Medicaid Other | Admitting: *Deleted

## 2016-01-29 DIAGNOSIS — J4540 Moderate persistent asthma, uncomplicated: Secondary | ICD-10-CM | POA: Diagnosis not present

## 2016-02-16 ENCOUNTER — Ambulatory Visit (INDEPENDENT_AMBULATORY_CARE_PROVIDER_SITE_OTHER): Payer: Medicaid Other

## 2016-02-16 DIAGNOSIS — J4540 Moderate persistent asthma, uncomplicated: Secondary | ICD-10-CM

## 2016-03-01 ENCOUNTER — Telehealth: Payer: Self-pay | Admitting: *Deleted

## 2016-03-01 ENCOUNTER — Ambulatory Visit (INDEPENDENT_AMBULATORY_CARE_PROVIDER_SITE_OTHER): Payer: Medicaid Other | Admitting: *Deleted

## 2016-03-01 DIAGNOSIS — J4540 Moderate persistent asthma, uncomplicated: Secondary | ICD-10-CM | POA: Diagnosis not present

## 2016-03-01 NOTE — Telephone Encounter (Signed)
[place] to [place] the summer was wondering if you knew an allergist down there you could recommend to continue her Xolair with. Please advise.

## 2016-03-02 NOTE — Telephone Encounter (Signed)
Please inform patient that I do not know of any allergists in [place]. Have her check with the [facility] or [facility] website for information about allergists in that area.

## 2016-03-02 NOTE — Telephone Encounter (Signed)
Spoke with pt, she stated she will go online and look for one in [place]

## 2016-03-14 ENCOUNTER — Ambulatory Visit (INDEPENDENT_AMBULATORY_CARE_PROVIDER_SITE_OTHER): Payer: Medicaid Other

## 2016-03-14 DIAGNOSIS — J4540 Moderate persistent asthma, uncomplicated: Secondary | ICD-10-CM

## 2016-03-29 ENCOUNTER — Ambulatory Visit (INDEPENDENT_AMBULATORY_CARE_PROVIDER_SITE_OTHER): Payer: Medicaid Other | Admitting: *Deleted

## 2016-03-29 DIAGNOSIS — J4540 Moderate persistent asthma, uncomplicated: Secondary | ICD-10-CM | POA: Diagnosis not present

## 2016-04-05 ENCOUNTER — Encounter: Payer: Self-pay | Admitting: Allergy and Immunology

## 2016-04-05 ENCOUNTER — Ambulatory Visit (INDEPENDENT_AMBULATORY_CARE_PROVIDER_SITE_OTHER): Payer: Medicaid Other | Admitting: Allergy and Immunology

## 2016-04-05 VITALS — BP 122/80 | HR 72 | Resp 20

## 2016-04-05 DIAGNOSIS — L509 Urticaria, unspecified: Secondary | ICD-10-CM

## 2016-04-05 DIAGNOSIS — J309 Allergic rhinitis, unspecified: Secondary | ICD-10-CM

## 2016-04-05 DIAGNOSIS — H1010 Acute atopic conjunctivitis, unspecified eye: Secondary | ICD-10-CM | POA: Diagnosis not present

## 2016-04-05 DIAGNOSIS — L209 Atopic dermatitis, unspecified: Secondary | ICD-10-CM

## 2016-04-05 DIAGNOSIS — R0789 Other chest pain: Secondary | ICD-10-CM | POA: Diagnosis not present

## 2016-04-05 DIAGNOSIS — J4540 Moderate persistent asthma, uncomplicated: Secondary | ICD-10-CM | POA: Diagnosis not present

## 2016-04-05 MED ORDER — OLOPATADINE HCL 0.7 % OP SOLN: 1.0000 [drp] | Freq: Every day | OPHTHALMIC | Status: DC

## 2016-04-05 MED ORDER — METHYLPREDNISOLONE ACETATE 80 MG/ML IJ SUSP
80.0000 mg | Freq: Once | INTRAMUSCULAR | Status: AC
  Administered 2016-04-05: 80 mg via INTRAMUSCULAR

## 2016-04-05 NOTE — Patient Instructions (Signed)
  1. Depo-Medrol 80 IM delivered in clinic today  2. Continue Dulera 200 - 2 inhalations twice a day  3. Continue Omnaris one spray each nostril one time per day  4. Continue Xolair and EpiPen  5. Continue Elidel followed by mometasone 0.1% ointment applied to eczema one-2 times a day  6. Continue Zyrtec 10 mg one tablet 1-2 times a day  7. Continue ProAir HFA or albuterol as needed  8. Continue ibuprofen as needed  9. Continue Pepcid 20 mg one time per day as needed  10. Return to clinic in 6 months or earlier if problem

## 2016-04-05 NOTE — Progress Notes (Signed)
Follow-up Note  Referring Provider: Willow OraAndy, Camille L, MD Primary Provider: Willow OraANDY,CAMILLE L, MD Date of Office Visit: 04/05/2016  Subjective:   Danielle Bass (DOB: 04/19/1976) is a 40 y.o. female [facility] in re-evaluation of the following:  HPI: Dory HornDenetra presents to this clinic in reevaluation of her severe asthma and atopic dermatitis and allergic rhinitis and intermittent urticaria treated with omalizumab. I have not seen her in his clinic since July 2016.  It does not sound as though she has required a systemic steroid to treat an exacerbation of her atopic disease since her last visit in this clinic. For the most part she thinks that her asthma is under very good control while using her Xolair and Dulera and montelukast and she rarely uses a short acting bronchodilator averaging out to 1 time per week.  Her nose is doing quite well and she's either using Qnasl or Omnaris most days of the week to treat this condition.   She still continues to have some intermittent hives a few times per week without any associated systemic or constitutional symptoms while using the Zyrtec one time per day and occasionally some Benadryl.  Although her respiratory tract is doing well, her skin has started to flare once again. She's had some involvement of her face and her neck over the course of the past 2 months. Prior to that point in time her therapy was working quite well while she used her Elidel and mometasone as spot therapy about one or 2 times a day. She does not really note any obvious trigger giving rise to this flareup.  Her reflux has been under good control as well and she uses Pepcid on occasion. However, when she takes oxycodone or OxyContin for her back and radiculopathy she sometimes does get nausea.  She did have another episode of chest pain that was evaluated in emergency room last month with a normal EKG and chest x-ray for which she  took ibuprofen which does help this issue.    Medication List           albuterol 108 (90 Base) MCG/ACT inhaler  Commonly known as:  PROAIR HFA  Use 2 puffs every 4 hours as needed for cough or wheeze.  May use 2 puffs 10-20 minutes prior to exercise.     albuterol (2.5 MG/3ML) 0.083% nebulizer solution  Commonly known as:  PROVENTIL  Take 2.5 mg by nebulization every 4 (four) hours as needed for wheezing or shortness of breath.     amitriptyline 25 MG tablet  Commonly known as:  ELAVIL  Take 25 mg by mouth 2 (two) times daily as needed. Reported on 12/04/2015     Beclomethasone Dipropionate 80 MCG/ACT Aers  Commonly known as:  QNASL  Use 1-2 sprays in each nostril once daily for stuffy nose or congestion.     cetirizine 10 MG tablet  Commonly known as:  ZYRTEC  Take 10 mg by mouth daily.     diphenhydrAMINE 25 MG tablet  Commonly known as:  SOMINEX  Take 25 mg by mouth daily as needed for allergies or sleep. Reported on 12/04/2015     DULERA 200-5 MCG/ACT Aero  Generic drug:  mometasone-formoterol  Inhale 2 puffs into the lungs 2 (two) times daily.     EPIPEN 2-PAK 0.3 mg/0.3 mL Soaj injection  Generic drug:  EPINEPHrine  Inject 0.3 mg into the muscle as needed.  FLUoxetine 20 MG capsule  Commonly known as:  PROZAC  Take 20 mg by mouth every morning. Reported on 12/04/2015     ibuprofen 800 MG tablet  Commonly known as:  ADVIL,MOTRIN  Take 1 tablet (800 mg total) by mouth 3 (three) times daily.     LATUDA 80 MG Tabs tablet  Generic drug:  lurasidone  Take 80 mg by mouth daily. Reported on 04/05/2016     mometasone 0.1 % cream  Commonly known as:  ELOCON  Apply 1-2 times daily to red rash areas at body as needed.  DO NOT APPLY TO FACE.     montelukast 10 MG tablet  Commonly known as:  SINGULAIR  TAKE ONE EACH EVENING TO PREVENT COUGH OR WHEEZE.     Olopatadine HCl 0.7 % Soln  Commonly known as:  PAZEO  Apply 1 drop to eye daily. As needed for itchy eyes.      oxyCODONE 15 MG immediate release tablet  Commonly known as:  ROXICODONE  Take 15 mg by mouth 2 (two) times daily.     oxyCODONE-acetaminophen 10-325 MG tablet  Commonly known as:  PERCOCET  Take 1 tablet by mouth as needed for pain.     pimecrolimus 1 % cream  Commonly known as:  ELIDEL  Apply once daily to red rash areas as needed.     pregabalin 25 MG capsule  Commonly known as:  LYRICA  Take 25 mg by mouth daily. Reported on 04/05/2016     traMADol 50 MG tablet  Commonly known as:  ULTRAM  Take 50 mg by mouth daily as needed for moderate pain. Reported on 12/04/2015     XOLAIR 150 MG injection  Generic drug:  omalizumab  RECONSTITUTE AS DIRECTED INJECT 375 MG SUBCUTANEOUSLY EVERY 2 WEEKS        Past Medical History  Diagnosis Date  . Eczema   . Asthma   . Bronchitis   . Chronic back pain   . Depression   . Acid reflux     Past Surgical History  Procedure Laterality Date  . Leg surgery      tib fib left?    Allergies  Allergen Reactions  . Other Anaphylaxis    Tree nut, Tomato sauce  . Aspirin Other (See Comments)    Other reaction(s): Unknown "doctor doesn't want her to take" "doctor doesn't want her to take"    Review of systems negative except as noted in HPI / PMHx or noted below:  Review of Systems  Constitutional: Negative.   HENT: Negative.   Eyes: Negative.   Respiratory: Negative.   Cardiovascular: Negative.   Gastrointestinal: Negative.   Genitourinary: Negative.   Musculoskeletal: Negative.   Skin: Negative.   Neurological: Negative.   Endo/Heme/Allergies: Negative.   Psychiatric/Behavioral: Negative.      Objective:   Filed Vitals:   04/05/16 1008  BP: 122/80  Pulse: 72  Resp: 20          Physical Exam  Constitutional: She is well-developed, well-nourished, and in no distress.  HENT:  Head: Normocephalic.  Right Ear: Tympanic membrane, external ear and ear canal normal.  Left Ear: Tympanic membrane, external ear  and ear canal normal.  Nose: Nose normal. No mucosal edema or rhinorrhea.  Mouth/Throat: Uvula is midline, oropharynx is clear and moist and mucous membranes are normal. No oropharyngeal exudate.  Eyes: Conjunctivae are normal.  Neck: Trachea normal. No tracheal tenderness present. No tracheal deviation present. No thyromegaly present.  Cardiovascular: Normal rate, regular rhythm, S1 normal, S2 normal and normal heart sounds.   No murmur heard. Pulmonary/Chest: Breath sounds normal. No stridor. No respiratory distress. She has no wheezes. She has no rales.  Musculoskeletal: She exhibits no edema.  Lymphadenopathy:       Head (right side): No tonsillar adenopathy present.       Head (left side): No tonsillar adenopathy present.    She has no cervical adenopathy.  Neurological: She is alert. Gait normal.  Skin: Rash (Erythematous scaly patches involving 4 head and neck. Areas of hyperpigmentation involving torso and extremities. Areas of vitiligo involving extremities) noted. She is not diaphoretic. No erythema. Nails show no clubbing.  Psychiatric: Mood and affect normal.    Diagnostics:    Spirometry was performed and demonstrated an FEV1 of 2.72 at 110 % of predicted.  The patient had an Asthma Control Test with the following results: ACT Total Score: 14.    Assessment and Plan:   1. Moderate persistent asthma, uncomplicated   2. Allergic rhinoconjunctivitis   3. Atopic dermatitis   4. Urticaria   5. Chest pain, musculoskeletal      1. Depo-Medrol 80 IM delivered in clinic today  2. Continue Dulera 200 - 2 inhalations twice a day  3. Continue Omnaris one spray each nostril one time per day  4. Continue Xolair and EpiPen  5. Continue Elidel followed by mometasone 0.1% ointment applied to eczema one-2 times a day  6. Continue Zyrtec 10 mg one tablet 1-2 times a day  7. Continue ProAir HFA or albuterol as needed  8. Continue ibuprofen as needed  9. Continue Pepcid 20  mg one time per day as needed  10. Return to clinic in 6 months or earlier if problem  Annmargaret was treated with a systemic steroid today in an attempt to calm down her immunological hyperreactivity involving her skin. This is her first systemic steroid in almost 10 months which is a very good development on her part. It does appear as though the administration of omalizumab has resulted in pretty good control of her multiorgan atopic disease. I've asked her to consolidate her use of Elidel and mometasone topical ointment as much as possible given some of the side effects associated with these agents. She can also use ibuprofen for what appears to be intermittent musculoskeletal chest pain and use Pepcid for what appears to be intermittent reflux. She'll continue to use anti-inflammatory agents for both her upper and lower airways as noted above and I will see her back in this clinic in approximately 6 months or earlier if there is a problem. It should be noted that there is a possibility that she will be moving to [place] sometime in the near future.  Laurette Schimke, MD [facility]

## 2016-04-07 ENCOUNTER — Ambulatory Visit: Payer: Medicaid Other | Admitting: Allergy and Immunology

## 2016-04-21 ENCOUNTER — Ambulatory Visit (INDEPENDENT_AMBULATORY_CARE_PROVIDER_SITE_OTHER): Payer: Medicaid Other

## 2016-04-21 DIAGNOSIS — J4540 Moderate persistent asthma, uncomplicated: Secondary | ICD-10-CM

## 2016-05-02 ENCOUNTER — Ambulatory Visit (INDEPENDENT_AMBULATORY_CARE_PROVIDER_SITE_OTHER): Payer: Medicaid Other

## 2016-05-02 DIAGNOSIS — J4540 Moderate persistent asthma, uncomplicated: Secondary | ICD-10-CM

## 2016-05-19 ENCOUNTER — Ambulatory Visit: Payer: Medicaid Other | Admitting: Allergy and Immunology

## 2016-05-19 ENCOUNTER — Ambulatory Visit (INDEPENDENT_AMBULATORY_CARE_PROVIDER_SITE_OTHER): Payer: Medicaid Other | Admitting: Allergy and Immunology

## 2016-05-19 VITALS — BP 112/76 | HR 80 | Resp 16

## 2016-05-19 DIAGNOSIS — H1010 Acute atopic conjunctivitis, unspecified eye: Secondary | ICD-10-CM

## 2016-05-19 DIAGNOSIS — J309 Allergic rhinitis, unspecified: Secondary | ICD-10-CM | POA: Diagnosis not present

## 2016-05-19 DIAGNOSIS — J4550 Severe persistent asthma, uncomplicated: Secondary | ICD-10-CM

## 2016-05-19 DIAGNOSIS — L209 Atopic dermatitis, unspecified: Secondary | ICD-10-CM

## 2016-05-19 MED ORDER — MOMETASONE FURO-FORMOTEROL FUM 200-5 MCG/ACT IN AERO: 2.0000 | INHALATION_SPRAY | Freq: Two times a day (BID) | RESPIRATORY_TRACT | Status: DC

## 2016-05-19 MED ORDER — MONTELUKAST SODIUM 10 MG PO TABS: 10.0000 mg | ORAL_TABLET | Freq: Every day | ORAL | Status: DC

## 2016-05-19 NOTE — Patient Instructions (Addendum)
   For duration of sample of use Dymista one spray twice daily, then return to Qnasl.  Continue Zyrtec 10mg  once daily.  Saline nasal wash each evening at shower time.  Continue Dulera, Pazeo daily and restart Singulair 10mg  each evening.  Moisturize skin regularly.  Use Aclovate cream to facial rash once daily for the next week, then stop.  Call with update next week.  ProAir/Albuterol/Epi-pen/benadryl as needed.  Establish with new physicians as discussed.  Follow-up here in the next 2 weeks for Xolair.

## 2016-05-19 NOTE — Progress Notes (Signed)
FOLLOW UP NOTE  RE: Danielle QuakerDenetra J Jun MRN: [id number] DOB: 08/25/1976 [facility] [address] [place] [id number] Date of Office Visit: 05/19/2016  Subjective:  Danielle Bass is a 40 y.o. female who presents today for Angioedema  Assessment:   1. History of hives with reported facial exacerbation, currently clear without any swelling.    2. Atopic dermatitis exacerbation with significant xerosis.   3. Moderate/severe persistent asthma, well controlled.   4.      Allergic rhinoconjunctivitis. 5.      Recent ED visit with diagnosis of skin infection, though unclear to patient, prescription for antibiotic course. 6.      Complex medical history, including chronic pain management as followed by pain specialists. Plan:   Meds ordered this encounter  Medications  . montelukast (SINGULAIR) 10 MG tablet    Sig: Take 1 tablet (10 mg total) by mouth at bedtime.    Dispense:  30 tablet    Refill:  5  . mometasone-formoterol (DULERA) 200-5 MCG/ACT AERO    Sig: Inhale 2 puffs into the lungs 2 (two) times daily.    Dispense:  1 Inhaler    Refill:  3  1.   For duration of sample of use Dymista one spray twice daily, then return to Qnasl. 2.   Continue Zyrtec 10mg  once daily. 3.   Saline nasal wash each evening at shower time. 4.   Continue Dulera, Pazeo daily and restart Singulair 10mg  each evening. 5.   Moisturize skin regularly--avoiding fragranced products. 6.   Use Aclovate cream to facial rash once daily for the next week, then stop. 7.   Call with update next week. 8.   ProAir/Albuterol/Epi-pen/benadryl as needed. 9.   Establish with new physicians as discussed. 10. Follow-up here in the next 2 weeks for Xolair And as scheduled with Dr. Lucie LeatherKozlow.  HPI: Danielle Bass returns to the office with report of recent facial swelling.  She describes intermittent rhinorrhea, congestion, sneezing in the last week with increasing pruritic facial rash at her  cheeks and chin without fever or facial warmth/tenderness.  She had been moisturizing as usual, participating in normal activities but noticed increase facial puffiness around her eyes over the last 4 days.  Symptoms increased and awakened with swelling over large areas of her face, but no lip, tongue, throat, swelling, dysphagia or difficulty breathing.  Therefore, went to the emergency department and per paperwork they prescribed antibiotic, sulfa for skin infection (though Jhane reports she did not understand why).  She denies any cough, wheeze, shortness of breath, difficulty in breathing or need for albuterol use, fever, sore throat or headache.  She has noted mild eczema, facial irritation, therefore, was using Benadryl and Elidel twice a day.  She has not had Singulair available in the last month. Reports sleep and activity are normal.  She is working between here and [place].  Danielle Bass has a current medication list which includes the following prescription(s): albuterol, albuterol, azelastine, beclomethasone dipropionate, cetirizine, diclofenac sodium, epinephrine, ibuprofen, mometasone, mometasone-formoterol, ondansetron, oxycodone, oxycontin, pimecrolimus and the following Facility-Administered Medications: omalizumab.   Drug Allergies: Allergies  Allergen Reactions  . Other Anaphylaxis    Tree nut, Tomato sauce  . Aspirin Other (See Comments)    Other reaction(s): Unknown "doctor doesn't want her to take" "doctor doesn't want her to take"   Objective:   Filed Vitals:   05/19/16 0916  BP: 112/76  Pulse: 80  Resp: 16  SpO2 Readings from Last 1 Encounters:  05/19/16 99%   Physical Exam  Constitutional: She is well-developed, well-nourished, and in no distress.  HENT:  Head: Atraumatic.  Right Ear: Tympanic membrane and ear canal normal.  Left Ear: Tympanic membrane and ear canal normal.  Nose: Mucosal edema present. No rhinorrhea. No epistaxis.  Mouth/Throat: Oropharynx is  clear and moist and mucous membranes are normal. No oropharyngeal exudate, posterior oropharyngeal edema or posterior oropharyngeal erythema.  Neck: Neck supple.  Cardiovascular: Normal rate, S1 normal and S2 normal.   No murmur heard. Pulmonary/Chest: Effort normal. She has no wheezes. She has no rhonchi. She has no rales.  Lymphadenopathy:    She has no cervical adenopathy.  Skin: Skin is dry. No cyanosis. Nails show no clubbing.    Generalized xerosis with chronic postinflammatory pigmentation changes few tiny papular eczematous rash areas at cheeks.     Diagnostics: Spirometry:  FVC 2.98--102%, FEV1 2.58--105%.  See scanned image.    Shivani Barrantes M. Willa RoughHicks, MD  cc: Willow OraANDY,CAMILLE L, MD

## 2016-05-22 DIAGNOSIS — M79606 Pain in leg, unspecified: Secondary | ICD-10-CM | POA: Insufficient documentation

## 2016-05-31 ENCOUNTER — Ambulatory Visit (INDEPENDENT_AMBULATORY_CARE_PROVIDER_SITE_OTHER): Payer: Medicaid Other | Admitting: *Deleted

## 2016-05-31 DIAGNOSIS — J4540 Moderate persistent asthma, uncomplicated: Secondary | ICD-10-CM

## 2016-10-11 ENCOUNTER — Ambulatory Visit: Payer: Medicaid Other | Admitting: Allergy and Immunology

## 2016-10-11 ENCOUNTER — Telehealth: Payer: Self-pay | Admitting: *Deleted

## 2016-10-11 NOTE — Telephone Encounter (Signed)
Left message for patient to call office. At one point patient stated she was moving.

## 2016-10-11 NOTE — Telephone Encounter (Signed)
-----   Message from Jessica PriestEric J Kozlow, MD sent at 10/11/2016  4:14 PM EST ----- Please contact patient and ask her if she would like to start dupilumab which is specifically directed against atopic dermatitis/eczema and would replace her Xolair.

## 2016-10-12 NOTE — Telephone Encounter (Signed)
Just FYI she has not been on Xolair since July- currently inactive

## 2019-06-11 ENCOUNTER — Other Ambulatory Visit: Payer: Self-pay

## 2019-06-11 ENCOUNTER — Ambulatory Visit
Admission: EM | Admit: 2019-06-11 | Discharge: 2019-06-11 | Disposition: A | Discharge status: Home / Self Care | Payer: Medicare Other | Attending: Physician Assistant | Admitting: Physician Assistant

## 2019-06-11 DIAGNOSIS — L2489 Irritant contact dermatitis due to other agents: Secondary | ICD-10-CM | POA: Diagnosis not present

## 2019-06-11 MED ORDER — DOXYCYCLINE HYCLATE 100 MG PO CAPS: 100.0000 mg | ORAL_CAPSULE | Freq: Two times a day (BID) | ORAL | 0 refills | Status: DC

## 2019-06-11 MED ORDER — METHYLPREDNISOLONE SODIUM SUCC 125 MG IJ SOLR
80.0000 mg | Freq: Once | INTRAMUSCULAR | Status: AC
  Administered 2019-06-11: 17:00:00 80 mg via INTRAMUSCULAR

## 2019-06-11 MED ORDER — PREDNISONE 50 MG PO TABS: 50.0000 mg | ORAL_TABLET | Freq: Every day | ORAL | 0 refills | Status: DC

## 2019-06-11 NOTE — ED Provider Notes (Signed)
[name]    CSN: [id number] Arrival date & time: 06/11/19  1602     History   Chief Complaint Chief Complaint  Patient presents with  . Rash    HPI Danielle Bass is a 43 y.o. female.   43 year old female comes in for swelling, redness, pain to the bilateral upper thighs/abdomen after tubing yesterday.  States area is now very tender to palpation, and have trouble sitting due to the pain.  Denies fever, chills, body aches.  Patient states had multiple spiders around her during tubing, but unknown any bites.  Unsure of any tick bites.  Danielle Bass wonders if Danielle Bass is having an allergic reaction to something Danielle Bass came in contact with.      Past Medical History:  Diagnosis Date  . Acid reflux   . Asthma   . Bronchitis   . Chronic back pain   . Depression   . Eczema     Patient Active Problem List   Diagnosis Date Noted  . Leg pain 05/22/2016  . Severe persistent asthma 07/25/2015  . Atopic dermatitis 07/25/2015  . Allergic rhinitis 07/25/2015  . Allergic rhinoconjunctivitis 07/25/2015  . Idiopathic urticaria 07/25/2015  . GERD (gastroesophageal reflux disease) 07/25/2015  . Allergy with anaphylaxis due to food 07/25/2015  . Arthralgia, sacroiliac 11/20/2013  . Chronic pain due to injury 09/19/2013  . Nerve root pain 09/19/2013  . LBP (low back pain) 08/16/2013  . Chronic pain associated with significant psychosocial dysfunction 08/16/2013  . Left ovarian cyst 01/02/2013  . Hypokalemia 01/02/2013  . Abdominal pain 12/31/2012  . Renal insufficiency 12/31/2012  . Leukocytosis, unspecified 12/31/2012  . Nausea and vomiting 12/31/2012    Past Surgical History:  Procedure Laterality Date  . LEG SURGERY     tib fib left?    OB History   No obstetric history on file.      Home Medications    Prior to Admission medications   Medication Sig Start Date End Date Taking? Authorizing Provider  albuterol (PROAIR HFA) 108 (90 Base) MCG/ACT inhaler Use 2  puffs every 4 hours as needed for cough or wheeze.  May use 2 puffs 10-20 minutes prior to exercise. 12/04/15   Baxter HireHicks, Roselyn M, MD  albuterol (PROVENTIL) (2.5 MG/3ML) 0.083% nebulizer solution Take 2.5 mg by nebulization every 4 (four) hours as needed for wheezing or shortness of breath.    [provider]  azelastine (ASTELIN) 0.1 % nasal spray Place 1 spray into the nose 2 (two) times daily as needed.    [provider]  Beclomethasone Dipropionate (QNASL) 80 MCG/ACT AERS Use 1-2 sprays in each nostril once daily for stuffy nose or congestion. 12/04/15   Baxter HireHicks, Roselyn M, MD  cetirizine (ZYRTEC) 10 MG tablet Take 10 mg by mouth daily.    [provider]  diclofenac sodium (VOLTAREN) 1 % GEL Apply 1 application topically daily as needed. 11/17/14   [provider]  diphenhydrAMINE (SOMINEX) 25 MG tablet Take 25 mg by mouth daily as needed for allergies or sleep. Reported on 05/19/2016    [provider]  doxycycline (VIBRAMYCIN) 100 MG capsule Take 1 capsule (100 mg total) by mouth 2 (two) times daily. 06/11/19   Cathie HoopsYu, Abryanna Musolino V, PA-C  EPINEPHrine (EPIPEN 2-PAK) 0.3 mg/0.3 mL IJ SOAJ injection Inject 0.3 mg into the muscle as needed.    [provider]  ibuprofen (ADVIL,MOTRIN) 800 MG tablet Take 1 tablet (800 mg total) by mouth 3 (three) times  daily. 10/02/13   Roxy HorsemanBrowning, Robert, PA-C  mometasone (ELOCON) 0.1 % cream Apply 1-2 times daily to red rash areas at body as needed.  DO NOT APPLY TO FACE. 12/04/15   Baxter HireHicks, Roselyn M, MD  mometasone-formoterol [facility](DULERA) 200-5 MCG/ACT AERO Inhale 2 puffs into the lungs 2 (two) times daily. 05/19/16   Baxter HireHicks, Roselyn M, MD  montelukast (SINGULAIR) 10 MG tablet TAKE ONE EACH EVENING TO PREVENT COUGH OR WHEEZE. Patient not taking: Reported on 05/22/2016 12/04/15   Baxter HireHicks, Roselyn M, MD  montelukast (SINGULAIR) 10 MG tablet Take 1 tablet (10 mg total) by mouth at bedtime. 05/19/16   Baxter HireHicks, Roselyn M, MD  Olopatadine HCl (PAZEO)  0.7 % SOLN Apply 1 drop to eye daily. As needed for itchy eyes. Patient not taking: Reported on 05/19/2016 04/05/16   Jessica PriestKozlow, Eric J, MD  ondansetron (ZOFRAN) 4 MG tablet Take 1 tablet by mouth every 4 (four) hours as needed. 01/02/13   [provider]  oxyCODONE (ROXICODONE) 15 MG immediate release tablet Take 15 mg by mouth 2 (two) times daily.    [provider]  oxyCODONE-acetaminophen (PERCOCET) 10-325 MG per tablet Take 1 tablet by mouth as needed for pain. Reported on 05/19/2016    [provider]  OXYCONTIN 10 MG 12 hr tablet Take 1 tablet by mouth daily as needed. 03/09/16   [provider]  pimecrolimus (ELIDEL) 1 % cream Apply once daily to red rash areas as needed. 12/04/15   Baxter HireHicks, Roselyn M, MD  predniSONE (DELTASONE) 50 MG tablet Take 1 tablet (50 mg total) by mouth daily with breakfast. 06/11/19   Cathie HoopsYu, Arletha Marschke V, PA-C  tiZANidine (ZANAFLEX) 4 MG tablet Take 1 tablet by mouth every 4 (four) hours as needed. Reported on 05/19/2016 11/17/14   [provider]  Geoffry ParadiseXOLAIR 150 MG injection RECONSTITUTE AS DIRECTED INJECT 375 MG SUBCUTANEOUSLY EVERY 2 WEEKS 08/26/15   Kozlow, Alvira PhilipsEric J, MD    Family History Family History  Problem Relation Age of Onset  . Macular degeneration Mother     Social History Social History   Tobacco Use  . Smoking status: Never Smoker  . Smokeless tobacco: Never Used  Substance Use Topics  . Alcohol use: No  . Drug use: No     Allergies   Other and Aspirin   Review of Systems Review of Systems  Reason unable to perform ROS: See HPI as above.     Physical Exam Triage Vital Signs ED Triage Vitals [06/11/19 1622]  Enc Vitals Group     BP 133/88     Pulse Rate 90     Resp 18     Temp 98.5 F (36.9 C)     Temp Source Oral     SpO2 99 %     Weight      Height      Head Circumference      Peak Flow      Pain Score 5     Pain Loc      Pain Edu?      Excl. in GC?    No data found.  Updated Vital Signs BP  133/88 (BP Location: Left Arm)   Pulse 90   Temp 98.5 F (36.9 C) (Oral)   Resp 18   SpO2 99%   Visual Acuity Right Eye Distance:   Left Eye Distance:   Bilateral Distance:    Right Eye Near:   Left Eye Near:    Bilateral Near:  Physical Exam Constitutional:      General: Danielle Bass is not in acute distress.    Appearance: Danielle Bass is well-developed. Danielle Bass is not diaphoretic.  HENT:     Head: Normocephalic and atraumatic.  Eyes:     Conjunctiva/sclera: Conjunctivae normal.     Pupils: Pupils are equal, round, and reactive to light.  Pulmonary:     Effort: Pulmonary effort is normal. No respiratory distress.  Skin:    General: Skin is warm and dry.          Comments: See affected area as circled above.  Area is erythematous, warm to touch, with tenderness.  Induration to the right upper thigh.  No fluctuance felt.  Hives to the abdomen with mild warmth.  Neurological:     Mental Status: Danielle Bass is alert and oriented to person, place, and time.      UC Treatments / Results  Labs (all labs ordered are listed, but only abnormal results are displayed) Labs Reviewed - No data to display  EKG   Radiology No results found.  Procedures Procedures (including critical care time)  Medications Ordered in UC Medications  methylPREDNISolone sodium succinate (SOLU-MEDROL) 125 mg/2 mL injection 80 mg (80 mg Intramuscular Given 06/11/19 1704)    Initial Impression / Assessment and Plan / UC Course  I have reviewed the triage vital signs and the nursing notes.  Pertinent labs & imaging results that were available during my care of the patient were reviewed by me and considered in my medical decision making (see chart for details).    Will cover for contact dermatitis/allergic reaction with Solu-Medrol and prednisone.  Will start doxycycline for possible cellulitis.  Patient to continue to monitor.  Return precautions given.  Patient expresses understanding and agrees with plan.  Final  Clinical Impressions(s) / UC Diagnoses   Final diagnoses:  Irritant contact dermatitis due to other agents   ED Prescriptions    Medication Sig Dispense Auth. Provider   predniSONE (DELTASONE) 50 MG tablet Take 1 tablet (50 mg total) by mouth daily with breakfast. 5 tablet Tashawna Thom V, PA-C   doxycycline (VIBRAMYCIN) 100 MG capsule Take 1 capsule (100 mg total) by mouth 2 (two) times daily. 20 capsule Tobin Chad, [place] 06/11/19 [phone]

## 2019-06-11 NOTE — ED Triage Notes (Signed)
[place] States [place]ed into rocks and has bruising to bilateral upper thighs and a rash from thighs all the way up to abdomen.

## 2019-06-11 NOTE — Discharge Instructions (Addendum)
Solumedrol injection in office today. Start prednisone as directed. Doxycycline to cover for infection. Avoid irritants/soap to the area for now. Monitor for spreading redness, warmth, fever, follow up for reevaluation needed.

## 2021-09-01 ENCOUNTER — Other Ambulatory Visit: Payer: Self-pay | Admitting: Orthopedic Surgery

## 2021-09-01 DIAGNOSIS — M545 Low back pain, unspecified: Secondary | ICD-10-CM

## 2021-09-12 ENCOUNTER — Ambulatory Visit
Admission: RE | Admit: 2021-09-12 | Discharge: 2021-09-12 | Disposition: A | Discharge status: Home / Self Care | Payer: Medicare Other | Source: Ambulatory Visit | Attending: Orthopedic Surgery | Admitting: Orthopedic Surgery

## 2021-09-12 DIAGNOSIS — M545 Low back pain, unspecified: Secondary | ICD-10-CM

## 2021-11-22 ENCOUNTER — Encounter: Payer: Self-pay | Admitting: Emergency Medicine

## 2021-11-22 ENCOUNTER — Ambulatory Visit
Admission: EM | Admit: 2021-11-22 | Discharge: 2021-11-22 | Disposition: A | Discharge status: Home / Self Care | Payer: Medicare Other | Attending: Physician Assistant | Admitting: Physician Assistant

## 2021-11-22 ENCOUNTER — Other Ambulatory Visit: Payer: Self-pay

## 2021-11-22 ENCOUNTER — Ambulatory Visit: Admission: EM | Admit: 2021-11-22 | Discharge status: Absent without leave | Payer: Self-pay

## 2021-11-22 DIAGNOSIS — J45901 Unspecified asthma with (acute) exacerbation: Secondary | ICD-10-CM

## 2021-11-22 MED ORDER — METHYLPREDNISOLONE SODIUM SUCC 125 MG IJ SOLR
80.0000 mg | Freq: Once | INTRAMUSCULAR | Status: AC
Start: 2021-11-22 — End: 2021-11-22
  Administered 2021-11-22: 80 mg via INTRAMUSCULAR

## 2021-11-22 MED ORDER — PREDNISONE 20 MG PO TABS: 40.0000 mg | ORAL_TABLET | Freq: Every day | ORAL | 0 refills | Status: AC

## 2021-11-22 NOTE — ED Triage Notes (Signed)
Came into town about a week ago and began having an allergic reaction from something at her mother's house. States it has triggered her asthma. Has been treating with her albuterol inhaler and benadryl to help without much improvement. Was taking dupixin but her insurance got messed up and has not been taking it.

## 2021-11-22 NOTE — ED Provider Notes (Signed)
[name]    CSN: [id number] Arrival date & time: 11/22/21  1621      History   Chief Complaint No chief complaint on file.   HPI Danielle Bass is a 46 y.o. female.   Patient here today for evaluation of suspected asthma exacerbation. She reports that about a week ago she started to have a suspected allergic reaction to something in her mother's home. She reports that she has had facial swelling. She has not had any throat swelling or difficulty swallowing or breathing. She has tried using her albuterol inhaler and benadryl without significant improvement.  The history is provided by the patient.   Past Medical History:  Diagnosis Date   Acid reflux    Asthma    Bronchitis    Chronic back pain    Depression    Eczema     Patient Active Problem List   Diagnosis Date Noted   Leg pain 05/22/2016   Severe persistent asthma 07/25/2015   Atopic dermatitis 07/25/2015   Allergic rhinitis 07/25/2015   Allergic rhinoconjunctivitis 07/25/2015   Idiopathic urticaria 07/25/2015   GERD (gastroesophageal reflux disease) 07/25/2015   Allergy with anaphylaxis due to food 07/25/2015   Arthralgia, sacroiliac 11/20/2013   Chronic pain due to injury 09/19/2013   Nerve root pain 09/19/2013   LBP (low back pain) 08/16/2013   Chronic pain associated with significant psychosocial dysfunction 08/16/2013   Left ovarian cyst 01/02/2013   Hypokalemia 01/02/2013   Abdominal pain 12/31/2012   Renal insufficiency 12/31/2012   Leukocytosis, unspecified 12/31/2012   Nausea and vomiting 12/31/2012    Past Surgical History:  Procedure Laterality Date   LEG SURGERY     tib fib left?    OB History   No obstetric history on file.      Home Medications    Prior to Admission medications   Medication Sig Start Date End Date Taking? Authorizing Provider  predniSONE (DELTASONE) 20 MG tablet Take 2 tablets (40 mg total) by mouth daily with breakfast for 5 days. 11/22/21 11/27/21  Yes Tomi Bamberger, PA-C  albuterol [facility] HFA) 108 (90 Base) MCG/ACT inhaler Use 2 puffs every 4 hours as needed for cough or wheeze.  May use 2 puffs 10-20 minutes prior to exercise. 12/04/15   Baxter Hire, MD  albuterol (PROVENTIL) (2.5 MG/3ML) 0.083% nebulizer solution Take 2.5 mg by nebulization every 4 (four) hours as needed for wheezing or shortness of breath.    [provider]  azelastine (ASTELIN) 0.1 % nasal spray Place 1 spray into the nose 2 (two) times daily as needed.    [provider]  Beclomethasone Dipropionate (QNASL) 80 MCG/ACT AERS Use 1-2 sprays in each nostril once daily for stuffy nose or congestion. 12/04/15   Baxter Hire, MD  cetirizine (ZYRTEC) 10 MG tablet Take 10 mg by mouth daily.    [provider]  diclofenac sodium (VOLTAREN) 1 % GEL Apply 1 application topically daily as needed. 11/17/14   [provider]  diphenhydrAMINE (SOMINEX) 25 MG tablet Take 25 mg by mouth daily as needed for allergies or sleep. Reported on 05/19/2016    [provider]  doxycycline (VIBRAMYCIN) 100 MG capsule Take 1 capsule (100 mg total) by mouth 2 (two) times daily. 06/11/19   Cathie Hoops, Amy V, PA-C  EPINEPHrine (EPIPEN 2-PAK) 0.3 mg/0.3 mL IJ SOAJ injection Inject 0.3 mg into the muscle as needed.    [provider]  ibuprofen (ADVIL,MOTRIN) 800 MG  tablet Take 1 tablet (800 mg total) by mouth 3 (three) times daily. 10/02/13   Roxy Horseman, PA-C  mometasone (ELOCON) 0.1 % cream Apply 1-2 times daily to red rash areas at body as needed.  DO NOT APPLY TO FACE. 12/04/15   Baxter Hire, MD  mometasone-formoterol [facility]) 200-5 MCG/ACT AERO Inhale 2 puffs into the lungs 2 (two) times daily. 05/19/16   Baxter Hire, MD  montelukast (SINGULAIR) 10 MG tablet TAKE ONE EACH EVENING TO PREVENT COUGH OR WHEEZE. Patient not taking: Reported on 05/22/2016 12/04/15   Baxter Hire, MD  montelukast (SINGULAIR) 10 MG tablet Take 1 tablet (10  mg total) by mouth at bedtime. 05/19/16   Baxter Hire, MD  Olopatadine HCl (PAZEO) 0.7 % SOLN Apply 1 drop to eye daily. As needed for itchy eyes. Patient not taking: Reported on 05/19/2016 04/05/16   Jessica Priest, MD  ondansetron (ZOFRAN) 4 MG tablet Take 1 tablet by mouth every 4 (four) hours as needed. 01/02/13   [provider]  oxyCODONE (ROXICODONE) 15 MG immediate release tablet Take 15 mg by mouth 2 (two) times daily.    [provider]  oxyCODONE-acetaminophen (PERCOCET) 10-325 MG per tablet Take 1 tablet by mouth as needed for pain. Reported on 05/19/2016    [provider]  OXYCONTIN 10 MG 12 hr tablet Take 1 tablet by mouth daily as needed. 03/09/16   [provider]  pimecrolimus (ELIDEL) 1 % cream Apply once daily to red rash areas as needed. 12/04/15   Baxter Hire, MD  tiZANidine (ZANAFLEX) 4 MG tablet Take 1 tablet by mouth every 4 (four) hours as needed. Reported on 05/19/2016 11/17/14   [provider]  Geoffry Paradise 150 MG injection RECONSTITUTE AS DIRECTED INJECT 375 MG SUBCUTANEOUSLY EVERY 2 WEEKS 08/26/15   Kozlow, Alvira Philips, MD    Family History Family History  Problem Relation Age of Onset   Macular degeneration Mother     Social History Social History   Tobacco Use   Smoking status: Never   Smokeless tobacco: Never  Substance Use Topics   Alcohol use: No   Drug use: No     Allergies   Other, Aspirin, and Gabapentin   Review of Systems Review of Systems  Constitutional:  Negative for chills and fever.  HENT:  Positive for congestion and facial swelling.   Eyes:  Negative for discharge and redness.  Respiratory:  Positive for cough and wheezing. Negative for shortness of breath.   Gastrointestinal:  Negative for nausea and vomiting.    Physical Exam Triage Vital Signs ED Triage Vitals [11/22/21 1821]  Enc Vitals Group     BP (!) 146/90     Pulse Rate 76     Resp 16     Temp 97.8 F (36.6 C)     Temp  Source Oral     SpO2 99 %     Weight      Height      Head Circumference      Peak Flow      Pain Score 6     Pain Loc      Pain Edu?      Excl. in GC?    No data found.  Updated Vital Signs BP (!) 146/90 (BP Location: Left Arm)    Pulse 76    Temp 97.8 F (36.6 C) (Oral)    Resp 16    SpO2 99%    Physical Exam  Vitals and nursing note reviewed.  Constitutional:      General: She is not in acute distress.    Appearance: Normal appearance. She is not ill-appearing.  HENT:     Head: Normocephalic and atraumatic.     Nose: Congestion (mild) present.     Mouth/Throat:     Mouth: Mucous membranes are moist.     Pharynx: No oropharyngeal exudate or posterior oropharyngeal erythema.  Eyes:     Conjunctiva/sclera: Conjunctivae normal.  Cardiovascular:     Rate and Rhythm: Normal rate and regular rhythm.     Heart sounds: Normal heart sounds. No murmur heard. Pulmonary:     Effort: Pulmonary effort is normal. No respiratory distress.     Breath sounds: Normal breath sounds. No wheezing, rhonchi or rales.  Skin:    General: Skin is warm and dry.  Neurological:     Mental Status: She is alert.  Psychiatric:        Mood and Affect: Mood normal.        Thought Content: Thought content normal.     UC Treatments / Results  Labs (all labs ordered are listed, but only abnormal results are displayed) Labs Reviewed - No data to display  EKG   Radiology No results found.  Procedures Procedures (including critical care time)  Medications Ordered in UC Medications  methylPREDNISolone sodium succinate (SOLU-MEDROL) 125 mg/2 mL injection 80 mg (80 mg Intramuscular Given 11/22/21 1846)    Initial Impression / Assessment and Plan / UC Course  I have reviewed the triage vital signs and the nursing notes.  Pertinent labs & imaging results that were available during my care of the patient were reviewed by me and considered in my medical decision making (see chart for  details).   Vitals reassuring and breath sounds clear on exam. Will administered steroid injection in office and will prescribe steroid burst for treatment. Encouraged follow up if no improvement with treatment. Advised she call 911 with any shortness of breath, swelling of her tongue, difficulty swallowing,etc.  Final Clinical Impressions(s) / UC Diagnoses   Final diagnoses:  Asthma with acute exacerbation, unspecified asthma severity, unspecified whether persistent   Discharge Instructions   None    ED Prescriptions     Medication Sig Dispense Auth. Provider   predniSONE (DELTASONE) 20 MG tablet Take 2 tablets (40 mg total) by mouth daily with breakfast for 5 days. 10 tablet Tomi BambergerMyers, Nekayla Heider F, PA-C      PDMP not reviewed this encounter.   Tomi BambergerMyers, Legrand Lasser F, PA-C 11/22/21 1859

## 2022-04-01 ENCOUNTER — Ambulatory Visit
Admission: EM | Admit: 2022-04-01 | Discharge: 2022-04-01 | Disposition: A | Discharge status: Home / Self Care | Payer: Medicare Other | Attending: Family Medicine | Admitting: Family Medicine

## 2022-04-01 DIAGNOSIS — L239 Allergic contact dermatitis, unspecified cause: Secondary | ICD-10-CM

## 2022-04-01 MED ORDER — METHYLPREDNISOLONE ACETATE 80 MG/ML IJ SUSP: 80.0000 mg | Freq: Once | INTRAMUSCULAR | Status: DC

## 2022-04-01 MED ORDER — PREDNISONE 20 MG PO TABS: 40.0000 mg | ORAL_TABLET | Freq: Every day | ORAL | 0 refills | Status: AC

## 2022-04-01 NOTE — Discharge Instructions (Addendum)
Been given a shot of Depo-Medrol 80 mg ? ?Take prednisone 20 mg--Take daily for 5 days; start this prescription if you still feel like you are improving left the next 24 hours ?

## 2022-04-01 NOTE — ED Provider Notes (Addendum)
?[name] ? ? ? ?CSN: [id number] ?Arrival date & time: 04/01/22  1642 ? ? ?  ? ?History   ?Chief Complaint ?Chief Complaint  ?Patient presents with  ? Rash  ? ? ?HPI ?Danielle Bass is a 46 y.o. female.  ? ? ?Rash ?For about a 1 week history of rash and itching on her face.  It is progressed down onto her neck for the last couple of days.  No fever, wheezing, no tongue or throat swelling.  She does feel like maybe her eyelids are. ? ?She has had this happen before.  She has had hypopigmentation topical steroids ? ?Past Medical History:  ?Diagnosis Date  ? Acid reflux   ? Asthma   ? Bronchitis   ? Chronic back pain   ? Depression   ? Eczema   ? ? ?Patient Active Problem List  ? Diagnosis Date Noted  ? Leg pain 05/22/2016  ? Severe persistent asthma 07/25/2015  ? Atopic dermatitis 07/25/2015  ? Allergic rhinitis 07/25/2015  ? Allergic rhinoconjunctivitis 07/25/2015  ? Idiopathic urticaria 07/25/2015  ? GERD (gastroesophageal reflux disease) 07/25/2015  ? Allergy with anaphylaxis due to food 07/25/2015  ? Arthralgia, sacroiliac 11/20/2013  ? Chronic pain due to injury 09/19/2013  ? Nerve root pain 09/19/2013  ? LBP (low back pain) 08/16/2013  ? Chronic pain associated with significant psychosocial dysfunction 08/16/2013  ? Left ovarian cyst 01/02/2013  ? Hypokalemia 01/02/2013  ? Abdominal pain 12/31/2012  ? Renal insufficiency 12/31/2012  ? Leukocytosis, unspecified 12/31/2012  ? Nausea and vomiting 12/31/2012  ? ? ?Past Surgical History:  ?Procedure Laterality Date  ? LEG SURGERY    ? tib fib left?  ? ? ?OB History   ?No obstetric history on file. ?  ? ? ? ?Home Medications   ? ?Prior to Admission medications   ?Medication Sig Start Date End Date Taking? Authorizing Provider  ?predniSONE (DELTASONE) 20 MG tablet Take 2 tablets (40 mg total) by mouth daily with breakfast for 5 days. 04/01/22 04/06/22 Yes Monika Chestang, Janace ArisPamela K, MD  ?albuterol (PROAIR HFA) 108 (90 Base) MCG/ACT inhaler Use 2 puffs every 4  hours as needed for cough or wheeze.  May use 2 puffs 10-20 minutes prior to exercise. 12/04/15   Baxter HireHicks, Roselyn M, MD  ?albuterol (PROVENTIL) (2.5 MG/3ML) 0.083% nebulizer solution Take 2.5 mg by nebulization every 4 (four) hours as needed for wheezing or shortness of breath.    [provider]  ?azelastine (ASTELIN) 0.1 % nasal spray Place 1 spray into the nose 2 (two) times daily as needed.    [provider]  ?Beclomethasone Dipropionate (QNASL) 80 MCG/ACT AERS Use 1-2 sprays in each nostril once daily for stuffy nose or congestion. 12/04/15   Baxter HireHicks, Roselyn M, MD  ?cetirizine (ZYRTEC) 10 MG tablet Take 10 mg by mouth daily.    [provider]  ?diclofenac sodium (VOLTAREN) 1 % GEL Apply 1 application topically daily as needed. 11/17/14   [provider]  ?diphenhydrAMINE (SOMINEX) 25 MG tablet Take 25 mg by mouth daily as needed for allergies or sleep. Reported on 05/19/2016    [provider]  ?EPINEPHrine (EPIPEN 2-PAK) 0.3 mg/0.3 mL IJ SOAJ injection Inject 0.3 mg into the muscle as needed.    [provider]  ?ibuprofen (ADVIL,MOTRIN) 800 MG tablet Take 1 tablet (800 mg total) by mouth 3 (three) times daily. 10/02/13   Roxy HorsemanBrowning, Robert, PA-C  ?mometasone (ELOCON) 0.1 % cream Apply 1-2 times daily to  red rash areas at body as needed.  DO NOT APPLY TO FACE. 12/04/15   Baxter Hire, MD  ?mometasone-formoterol [facility]) 200-5 MCG/ACT AERO Inhale 2 puffs into the lungs 2 (two) times daily. 05/19/16   Baxter Hire, MD  ?montelukast (SINGULAIR) 10 MG tablet TAKE ONE EACH EVENING TO PREVENT COUGH OR WHEEZE. ?Patient not taking: Reported on 05/22/2016 12/04/15   Baxter Hire, MD  ?montelukast (SINGULAIR) 10 MG tablet Take 1 tablet (10 mg total) by mouth at bedtime. 05/19/16   Baxter Hire, MD  ?Olopatadine HCl (PAZEO) 0.7 % SOLN Apply 1 drop to eye daily. As needed for itchy eyes. ?Patient not taking: Reported on 05/19/2016 04/05/16   Jessica Priest, MD   ?ondansetron (ZOFRAN) 4 MG tablet Take 1 tablet by mouth every 4 (four) hours as needed. 01/02/13   [provider]  ?pimecrolimus (ELIDEL) 1 % cream Apply once daily to red rash areas as needed. 12/04/15   Baxter Hire, MD  ?tiZANidine (ZANAFLEX) 4 MG tablet Take 1 tablet by mouth every 4 (four) hours as needed. Reported on 05/19/2016 11/17/14   [provider]  ?Geoffry Paradise 150 MG injection RECONSTITUTE AS DIRECTED INJECT 375 MG SUBCUTANEOUSLY EVERY 2 WEEKS 08/26/15   Kozlow, Alvira Philips, MD  ? ? ?Family History ?Family History  ?Problem Relation Age of Onset  ? Macular degeneration Mother   ? ? ?Social History ?Social History  ? ?Tobacco Use  ? Smoking status: Never  ? Smokeless tobacco: Never  ?Substance Use Topics  ? Alcohol use: No  ? Drug use: No  ? ? ? ?Allergies   ?Other, Aspirin, and Gabapentin ? ? ?Review of Systems ?Review of Systems  ?Skin:  Positive for rash.  ? ? ?Physical Exam ?Triage Vital Signs ?ED Triage Vitals [04/01/22 1737]  ?Enc Vitals Group  ?   BP 119/81  ?   Pulse Rate 83  ?   Resp 18  ?   Temp 98 ?F (36.7 ?C)  ?   Temp Source Oral  ?   SpO2 98 %  ?   Weight   ?   Height   ?   Head Circumference   ?   Peak Flow   ?   Pain Score 0  ?   Pain Loc   ?   Pain Edu?   ?   Excl. in GC?   ? ?No data found. ? ?Updated Vital Signs ?BP 119/81 (BP Location: Left Arm)   Pulse 83   Temp 98 ?F (36.7 ?C) (Oral)   Resp 18   SpO2 98%  ? ?Visual Acuity ?Right Eye Distance:   ?Left Eye Distance:   ?Bilateral Distance:   ? ?Right Eye Near:   ?Left Eye Near:    ?Bilateral Near:    ? ?Physical Exam ?Vitals reviewed.  ?Constitutional:   ?   General: She is not in acute distress. ?   Appearance: She is not toxic-appearing.  ?HENT:  ?   Nose: Nose normal.  ?   Mouth/Throat:  ?   Mouth: Mucous membranes are moist.  ?   Pharynx: No oropharyngeal exudate or posterior oropharyngeal erythema.  ?Eyes:  ?   Extraocular Movements: Extraocular movements intact.  ?   Conjunctiva/sclera: Conjunctivae normal.  ?    Pupils: Pupils are equal, round, and reactive to light.  ?Cardiovascular:  ?   Rate and Rhythm: Normal rate and regular rhythm.  ?   Heart sounds: No murmur heard. ?Pulmonary:  ?  Effort: Pulmonary effort is normal. No respiratory distress.  ?   Breath sounds: Normal breath sounds. No stridor. No wheezing.  ?Musculoskeletal:  ?   Cervical back: Neck supple.  ?Lymphadenopathy:  ?   Cervical: No cervical adenopathy.  ?Skin: ?   Coloration: Skin is not jaundiced or pale.  ?   Comments: There is a fine maculopapular rash on her face and neck. Facial features normal and no edema seen at present  ?Neurological:  ?   Mental Status: She is alert and oriented to person, place, and time.  ?Psychiatric:     ?   Behavior: Behavior normal.  ? ? ? ?UC Treatments / Results  ?Labs ?(all labs ordered are listed, but only abnormal results are displayed) ?Labs Reviewed - No data to display ? ?EKG ? ? ?Radiology ?No results found. ? ?Procedures ?Procedures (including critical care time) ? ?Medications Ordered in UC ?Medications  ?methylPREDNISolone acetate (DEPO-MEDROL) injection 80 mg (has no administration in time range)  ? ? ?Initial Impression / Assessment and Plan / UC Course  ?I have reviewed the triage vital signs and the nursing notes. ? ?Pertinent labs & imaging results that were available during my care of the patient were reviewed by me and considered in my medical decision making (see chart for details). ? ?  ? ?Will treat with steroid injection here. If not improving a lot tomorrow, she will pick up the prednisone rx. ?Final Clinical Impressions(s) / UC Diagnoses  ? ?Final diagnoses:  ?Allergic dermatitis  ? ? ? ?Discharge Instructions   ? ?  ?Been given a shot of Depo-Medrol 80 mg ? ?Take prednisone 20 mg--Take daily for 5 days; start this prescription if you still feel like you are improving left the next 24 hours ? ? ? ? ?ED Prescriptions   ? ? Medication Sig Dispense Auth. Provider  ? predniSONE (DELTASONE) 20 MG  tablet Take 2 tablets (40 mg total) by mouth daily with breakfast for 5 days. 10 tablet Zenia Resides, MD  ? ?  ? ?PDMP not reviewed this encounter. ?  ?Zenia Resides, MD ?04/01/22 1804 ? ?  ?Bernis Stecher, [place]

## 2022-04-01 NOTE — ED Triage Notes (Signed)
Pt c/o rash onset ~ Sunday. States feels "like my face is on fire and itching." Rash is primarily on face, neck. Pt states it's not on chest but rash has been moving down neck towards chest. Has tried benadryl without relief. Pt states unable to take topical steroids 2/2 skin discoloration.  ?

## 2022-06-28 ENCOUNTER — Encounter: Payer: Self-pay | Admitting: Allergy and Immunology

## 2022-06-28 ENCOUNTER — Ambulatory Visit (INDEPENDENT_AMBULATORY_CARE_PROVIDER_SITE_OTHER): Payer: Medicare Other | Admitting: Allergy and Immunology

## 2022-06-28 VITALS — BP 100/62 | HR 82 | Temp 97.8°F | Resp 16 | Ht 66.0 in | Wt 162.4 lb

## 2022-06-28 DIAGNOSIS — J3089 Other allergic rhinitis: Secondary | ICD-10-CM | POA: Diagnosis not present

## 2022-06-28 DIAGNOSIS — L819 Disorder of pigmentation, unspecified: Secondary | ICD-10-CM

## 2022-06-28 DIAGNOSIS — L209 Atopic dermatitis, unspecified: Secondary | ICD-10-CM

## 2022-06-28 DIAGNOSIS — L2089 Other atopic dermatitis: Secondary | ICD-10-CM

## 2022-06-28 DIAGNOSIS — J4550 Severe persistent asthma, uncomplicated: Secondary | ICD-10-CM | POA: Diagnosis not present

## 2022-06-28 MED ORDER — ZYRTEC ALLERGY 10 MG PO TBDP: 1.0000 | ORAL_TABLET | Freq: Every day | ORAL | 1 refills | Status: DC

## 2022-06-28 MED ORDER — EPINEPHRINE 0.3 MG/0.3ML IJ SOAJ: 0.3000 mg | INTRAMUSCULAR | 2 refills | Status: DC | PRN

## 2022-06-28 MED ORDER — VENTOLIN HFA 108 (90 BASE) MCG/ACT IN AERS: 2.0000 | INHALATION_SPRAY | RESPIRATORY_TRACT | 1 refills | Status: DC | PRN

## 2022-06-28 MED ORDER — DUPILUMAB 300 MG/2ML ~~LOC~~ SOSY
600.0000 mg | PREFILLED_SYRINGE | Freq: Once | SUBCUTANEOUS | Status: AC
Start: 2022-06-28 — End: 2022-06-28
  Administered 2022-06-28: 600 mg via SUBCUTANEOUS

## 2022-06-28 MED ORDER — MOMETASONE FUROATE 0.1 % EX CREA
TOPICAL_CREAM | CUTANEOUS | 5 refills | Status: DC
Start: 2022-06-28 — End: 2022-08-09

## 2022-06-28 NOTE — Progress Notes (Unsigned)
Immunotherapy   Patient Details  Name: Danielle Bass MRN: [id number] Date of Birth: [birth year]  06/28/2022  Danielle Bass started injections for  Dupixent 600mg   Frequency: Every 2 Weeks Epi-Pen: Not required  Consent signed and patient instructions given. Patient started Dupixent today and received 600mg  loading dose. Patient left without waiting since she has been on Dupixent previously.    Mort Smelser Fernandez-Vernon 06/28/2022, 3:59 PM

## 2022-06-28 NOTE — Patient Instructions (Addendum)
  1.  Start dupilumab.  600 mg loading dose today and then 300 mg every 2 weeks.  2.  [name] will work through approval for dupilumab administration  3.  If needed:  A. Albuterol HFA - 2 inhalations every 4-6 hours B. Cetirizine 10 mg - 1-2 tablets 1 time per day C. Bath/shower followed by Elidel followed by mometasone 0.1% ointment daily  4. "Stress steroids" ???  5. Treatment for vitiligo ???  6. Return to clinic in 8 weeks or earlier if problem  7. Obtain flu vaccine

## 2022-06-28 NOTE — Progress Notes (Unsigned)
- [place] - [place] - [place] - [place]   Dear Reino Kent,  Thank you for referring Danielle Bass to the [facility] [place] on 06/28/2022.   Below is a summation of this patient's evaluation and recommendations.  Thank you for your referral. I will keep you informed about this patient's response to treatment.   If you have any questions please do not hesitate to contact me.   Sincerely,  Jessica Priest, MD Allergy / Immunology  [facility]   ______________________________________________________________________    NEW PATIENT NOTE  Referring Provider: Audery Amel, MD Primary Provider: Patient, No Pcp Per Date of office visit: 06/28/2022    Subjective:   Chief Complaint:  Danielle Bass (DOB: [birth year]) is a 46 y.o. female who presents to the clinic on 06/28/2022 with a chief complaint of Asthma (Good), Eczema (Flare up since being off of dupixent x 1 yr due no isurance), and Allergic Rhinoconjunctivitis (Horrible ) .     HPI: Danielle Bass presents to this clinic in evaluation of multiorgan atopic disease including atopic dermatitis, severe asthma, allergic rhinitis, and intermittent urticaria.  I had seen her in this clinic previously in 2017 for these issues.  She has moved around to multiple states during that timeframe and she is now back in the [place] area.  She was using dupilumab to treat her multiorgan atopic disease and indeed when using this medication she had no problems with her skin and no problems with her nose and no problems with her chest and did not require any topical agents or inhaler agents or medications for her nose.    Unfortunately, because of an insurance issue she has been without avelumab for about a year.  She is being treated with recurrent administration of systemic steroids for flares of her eczema and flares of her asthma.  She states that she  has received approximately 6 or 8 systemic steroids during this timeframe and over the course of the past 3 months she has had systemic steroids 3 out of 4 weeks of the month.  Certainly she response to systemic steroids and then develops a rather significant flareup of her skin and breathing problem.  Past Medical History:  Diagnosis Date   Acid reflux    Asthma    Bronchitis    Chronic back pain    Depression    Eczema     Past Surgical History:  Procedure Laterality Date   LEG SURGERY     tib fib left?    Allergies as of 06/28/2022       Reactions   Other Anaphylaxis   Tree nut, Tomato sauce   Aspirin Other (See Comments)   Other reaction(s): Unknown "doctor doesn't want her to take" "doctor doesn't want her to take"   Gabapentin Itching   Hydromorphone Other (See Comments)   Drop in bp        Medication List    albuterol 108 (90 Base) MCG/ACT inhaler Commonly known as: ProAir HFA Use 2 puffs every 4 hours as needed for cough or wheeze.  May use 2 puffs 10-20 minutes prior to exercise.   albuterol (2.5 MG/3ML) 0.083% nebulizer solution Commonly known as: PROVENTIL Take 2.5 mg by nebulization every 4 (four) hours as needed for wheezing or shortness of breath.   azelastine 0.1 % nasal spray Commonly known as: ASTELIN Place 1 spray into the nose 2 (two) times daily as  needed.   Beclomethasone Dipropionate 80 MCG/ACT Aers Commonly known as: Qnasl Use 1-2 sprays in each nostril once daily for stuffy nose or congestion.   cetirizine 10 MG tablet Commonly known as: ZYRTEC Take 10 mg by mouth daily.   diphenhydrAMINE 25 MG tablet Commonly known as: SOMINEX Take 25 mg by mouth daily as needed for allergies or sleep. Reported on 05/19/2016   EpiPen 2-Pak 0.3 mg/0.3 mL Soaj injection Generic drug: EPINEPHrine Inject 0.3 mg into the muscle as needed.   mometasone 0.1 % cream Commonly known as: ELOCON Apply 1-2 times daily to red rash areas at body as needed.  DO  NOT APPLY TO FACE.   mometasone-formoterol 200-5 MCG/ACT Aero Commonly known as: Dulera Inhale 2 puffs into the lungs 2 (two) times daily.   montelukast 10 MG tablet Commonly known as: SINGULAIR TAKE ONE EACH EVENING TO PREVENT COUGH OR WHEEZE.   Olopatadine HCl 0.7 % Soln Commonly known as: Pazeo Apply 1 drop to eye daily. As needed for itchy eyes.   pimecrolimus 1 % cream Commonly known as: ELIDEL Apply once daily to red rash areas as needed.   tiZANidine 4 MG tablet Commonly known as: ZANAFLEX Take 1 tablet by mouth every 4 (four) hours as needed. Reported on 05/19/2016   Voltaren 1 % Gel Generic drug: diclofenac sodium Apply 1 application topically daily as needed.   Xolair 150 MG injection Generic drug: omalizumab RECONSTITUTE AS DIRECTED INJECT 375 MG SUBCUTANEOUSLY EVERY 2 WEEKS    Review of systems negative except as noted in HPI / PMHx or noted below:  Review of Systems  Constitutional: Negative.   HENT: Negative.    Eyes: Negative.   Respiratory: Negative.    Cardiovascular: Negative.   Gastrointestinal: Negative.   Genitourinary: Negative.   Musculoskeletal: Negative.   Skin: Negative.   Neurological: Negative.   Endo/Heme/Allergies: Negative.   Psychiatric/Behavioral: Negative.      Family History  Problem Relation Age of Onset   Macular degeneration Mother     Social History   Socioeconomic History   Marital status: Single    Spouse name: Not on file   Number of children: Not on file   Years of education: Not on file   Highest education level: Not on file  Occupational History   Not on file  Tobacco Use   Smoking status: Never   Smokeless tobacco: Never  Substance and Sexual Activity   Alcohol use: No   Drug use: No   Sexual activity: Not on file  Other Topics Concern   Not on file  Social History Narrative   Not on file   Social Determinants of Health   Financial Resource Strain: Not on file  Food Insecurity: Not on file   Transportation Needs: Not on file  Physical Activity: Not on file  Stress: Not on file  Social Connections: Not on file  Intimate Partner Violence: Not on file    Environmental and Social history  Lives in a house with a dry environment, no animals located inside the household, no carpet in the bedroom, no plastic on the bed, no plastic on the pillow, and no smoking ongoing with inside the household. Objective:   Vitals:   06/28/22 1410  BP: 100/62  Pulse: 82  Resp: 16  Temp: 97.8 F (36.6 C)  SpO2: 100%   Height: 5\' 6"  (167.6 cm) Weight: 162 lb 6.4 oz (73.7 kg)  Physical Exam Constitutional:      Appearance: She is not  diaphoretic.  HENT:     Head: Normocephalic.     Right Ear: Tympanic membrane, ear canal and external ear normal.     Left Ear: Tympanic membrane, ear canal and external ear normal.     Nose: Nose normal. No mucosal edema or rhinorrhea.     Mouth/Throat:     Pharynx: Uvula midline. No oropharyngeal exudate.  Eyes:     Conjunctiva/sclera: Conjunctivae normal.  Neck:     Thyroid: No thyromegaly.     Trachea: Trachea normal. No tracheal tenderness or tracheal deviation.  Cardiovascular:     Rate and Rhythm: Normal rate and regular rhythm.     Heart sounds: Normal heart sounds, S1 normal and S2 normal. No murmur heard. Pulmonary:     Effort: No respiratory distress.     Breath sounds: Normal breath sounds. No stridor. No wheezing or rales.  Lymphadenopathy:     Head:     Right side of head: No tonsillar adenopathy.     Left side of head: No tonsillar adenopathy.     Cervical: No cervical adenopathy.  Skin:    Findings: Rash (Lichenified scaly lesions in a patchy distribution affecting neck, anterior chest, forehead.  Widespread vitiligo right forearm and upper arm) present. No erythema.     Nails: There is no clubbing.  Neurological:     Mental Status: She is alert.     Diagnostics: Allergy skin tests were not performed.   Spirometry was  performed and demonstrated an FEV1 of 2.57 @ 97 % of predicted. FEV1/FVC = o.78   Assessment and Plan:    No diagnosis found.  Patient Instructions   1.  Start dupilumab.  600 mg loading dose today and then 300 mg every 2 weeks.  2.  [name] will work through approval for dupilumab administration  3.  If needed:  A. Albuterol HFA - 2 inhalations every 4-6 hours B. Cetirizine 10 mg - 1-2 tablets 1 time per day C. Bath/shower followed by Elidel followed by mometasone 0.1% ointment daily  4. "Stress steroids" ???  5. Treatment for vitiligo ???  6. Return to clinic in 8 weeks or earlier if problem  7. Obtain flu vaccine   Jessica Priest, MD Allergy / Immunology [facility] [place]

## 2022-06-29 ENCOUNTER — Encounter: Payer: Self-pay | Admitting: Allergy and Immunology

## 2022-06-29 ENCOUNTER — Telehealth: Payer: Self-pay | Admitting: *Deleted

## 2022-06-29 NOTE — Telephone Encounter (Signed)
Called patient and explained patient assistance due to medicare coverage. Will mail consent for same and advised need [place] PD[place] card if possible. Will be in touch regarding process

## 2022-06-29 NOTE — Telephone Encounter (Signed)
-----   Message from Ma Hillock, [place] sent at 06/28/2022  5:38 PM EDT ----- Regarding: Dupixent Patient started Dupixent today and received 600mg  loading does for Atopic Dermatitis. Advised patient that you will be in touch with her regarding insurance approval and for her next appointment.

## 2022-07-11 NOTE — Telephone Encounter (Signed)
Received consent I mailed to patient with undeliverable. Called patient and she advised new address and will mail same back to her

## 2022-08-04 ENCOUNTER — Encounter (HOSPITAL_COMMUNITY): Payer: Self-pay | Admitting: Emergency Medicine

## 2022-08-04 ENCOUNTER — Emergency Department (HOSPITAL_COMMUNITY): Payer: Medicare Other

## 2022-08-04 ENCOUNTER — Emergency Department (HOSPITAL_COMMUNITY)
Admission: EM | Admit: 2022-08-04 | Discharge: 2022-08-04 | Disposition: A | Discharge status: Home / Self Care | Payer: Medicare Other | Attending: Emergency Medicine | Admitting: Emergency Medicine

## 2022-08-04 DIAGNOSIS — L209 Atopic dermatitis, unspecified: Secondary | ICD-10-CM | POA: Diagnosis not present

## 2022-08-04 DIAGNOSIS — R109 Unspecified abdominal pain: Secondary | ICD-10-CM | POA: Diagnosis not present

## 2022-08-04 DIAGNOSIS — J45909 Unspecified asthma, uncomplicated: Secondary | ICD-10-CM | POA: Diagnosis not present

## 2022-08-04 DIAGNOSIS — N83202 Unspecified ovarian cyst, left side: Secondary | ICD-10-CM

## 2022-08-04 DIAGNOSIS — N939 Abnormal uterine and vaginal bleeding, unspecified: Secondary | ICD-10-CM | POA: Diagnosis present

## 2022-08-04 DIAGNOSIS — Z7951 Long term (current) use of inhaled steroids: Secondary | ICD-10-CM | POA: Insufficient documentation

## 2022-08-04 LAB — CBC WITH DIFFERENTIAL/PLATELET
Abs Immature Granulocytes: 0.02 10*3/uL (ref 0.00–0.07)
Basophils Absolute: 0 10*3/uL (ref 0.0–0.1)
Basophils Relative: 1 %
Eosinophils Absolute: 0.4 10*3/uL (ref 0.0–0.5)
Eosinophils Relative: 5 %
HCT: 34.9 % — ABNORMAL LOW (ref 36.0–46.0)
Hemoglobin: 10.7 g/dL — ABNORMAL LOW (ref 12.0–15.0)
Immature Granulocytes: 0 %
Lymphocytes Relative: 20 %
Lymphs Abs: 1.7 10*3/uL (ref 0.7–4.0)
MCH: 27.8 pg (ref 26.0–34.0)
MCHC: 30.7 g/dL (ref 30.0–36.0)
MCV: 90.6 fL (ref 80.0–100.0)
Monocytes Absolute: 0.6 10*3/uL (ref 0.1–1.0)
Monocytes Relative: 7 %
Neutro Abs: 5.7 10*3/uL (ref 1.7–7.7)
Neutrophils Relative %: 67 %
Platelets: 258 10*3/uL (ref 150–400)
RBC: 3.85 MIL/uL — ABNORMAL LOW (ref 3.87–5.11)
RDW: 14 % (ref 11.5–15.5)
WBC: 8.4 10*3/uL (ref 4.0–10.5)
nRBC: 0 % (ref 0.0–0.2)

## 2022-08-04 LAB — BASIC METABOLIC PANEL
Anion gap: 4 — ABNORMAL LOW (ref 5–15)
BUN: 8 mg/dL (ref 6–20)
CO2: 26 mmol/L (ref 22–32)
Calcium: 8.7 mg/dL — ABNORMAL LOW (ref 8.9–10.3)
Chloride: 109 mmol/L (ref 98–111)
Creatinine, Ser: 0.73 mg/dL (ref 0.44–1.00)
GFR, Estimated: >60 mL/min (ref >60)
Glucose, Bld: 80 mg/dL (ref 70–99)
Potassium: 3.7 mmol/L (ref 3.5–5.1)
Sodium: 139 mmol/L (ref 135–145)

## 2022-08-04 MED ORDER — DEXAMETHASONE SODIUM PHOSPHATE 10 MG/ML IJ SOLN
10.0000 mg | Freq: Once | INTRAMUSCULAR | Status: AC
  Administered 2022-08-04: 10 mg via INTRAMUSCULAR
  Filled 2022-08-04: qty 1

## 2022-08-04 NOTE — ED Triage Notes (Signed)
Patient reports day 8 of menstrual cycle with heavy bleeding and large clots. States changing pad every hour.

## 2022-08-04 NOTE — Discharge Instructions (Addendum)
Your imaging today did not show any obvious causes of your heavy vaginal bleeding.  They did see several cyst on your left ovary.  Please follow-up with your OB/GYN for further evaluation of both the cysts and the heavy bleeding.  I have given you an injection of steroids here in the emergency department to help with the burning and inflammation of the skin on your neck.  Follow-up with your allergist as soon as possible.  Hope you feel better soon.

## 2022-08-04 NOTE — Telephone Encounter (Signed)
Danielle Bass came into the office asking for the form that never showed up to her home.  I advised her of [name]'s note and updated her address.  Her address was missing a number with the apartment letter.  Patient is requesting instead of mailing form, could you please fax to the [place] office and she will come by and pick it up.

## 2022-08-04 NOTE — ED Provider Notes (Signed)
[facility]-EMERGENCY DEPT Provider Note   CSN: [id number] Arrival date & time: 08/04/22  1046     History  Chief Complaint  Patient presents with   Vaginal Bleeding    Danielle Bass is a 46 y.o. female with history of left-sided ovarian cysts, eczema, asthma who presents to the emergency department with concern for heavy menstrual bleeding.  Patient states that she got her menstrual period about 8 days ago and bleeding has been excessive.  She is going through 1 pad or tampon every 2 hours with numerous clots.  Over the last day or 2, she has developed symptoms of lightheadedness upon standing.  She denies abdominal pain, nausea, vomiting or diarrhea.  She denies vaginal pain and dysuria.   Additionally, patient is complaining of burning and irritation of the skin of the front of her neck extending to her jawline.  Patient has extensive history of atopic dermatitis and allergic reactions and follows closely with Dr. Lucie Leather at the allergy and asthma clinic.  She states these most recent symptoms started close to 1 week ago and she has flareup of burning pain when placing even water over her neck.  Denies any changes in detergents, lotions or creams.  She states typically she gets steroid injections for flareups.  Last month, she also started receiving Dupixent injections.  She tried to go to Dr. Lucie Leather today, however he was not in the office so she came to the emergency department.  She denies shortness of breath, wheezing, fevers, tongue swelling and facial swelling.   Vaginal Bleeding Associated symptoms: no abdominal pain, no back pain, no dysuria, no fever and no nausea        Home Medications Prior to Admission medications   Medication Sig Start Date End Date Taking? Authorizing Provider  albuterol (PROVENTIL) (2.5 MG/3ML) 0.083% nebulizer solution Take 2.5 mg by nebulization every 4 (four) hours as needed for wheezing or shortness of breath.    [provider]  azelastine (ASTELIN) 0.1 % nasal spray Place 1 spray into the nose 2 (two) times daily as needed. Patient not taking: Reported on 06/28/2022    [provider]  Beclomethasone Dipropionate (QNASL) 80 MCG/ACT AERS Use 1-2 sprays in each nostril once daily for stuffy nose or congestion. Patient not taking: Reported on 06/28/2022 12/04/15   Baxter Hire, MD  cetirizine (ZYRTEC) 10 MG tablet Take 10 mg by mouth daily. Patient not taking: Reported on 06/28/2022    [provider]  Cetirizine HCl (ZYRTEC ALLERGY) 10 MG TBDP Take 1 tablet by mouth daily. 06/28/22   Kozlow, Alvira Philips, MD  diclofenac sodium (VOLTAREN) 1 % GEL Apply 1 application topically daily as needed. Patient not taking: Reported on 06/28/2022 11/17/14   [provider]  diphenhydrAMINE (SOMINEX) 25 MG tablet Take 25 mg by mouth daily as needed for allergies or sleep. Reported on 05/19/2016 Patient not taking: Reported on 06/28/2022    [provider]  EPINEPHrine (EPIPEN 2-PAK) 0.3 mg/0.3 mL IJ SOAJ injection Inject 0.3 mg into the muscle as needed. 06/28/22   Kozlow, Alvira Philips, MD  mometasone (ELOCON) 0.1 % cream Apply 1-2 times daily to red rash areas at body as needed.  DO NOT APPLY TO FACE. 06/28/22   Kozlow, Alvira Philips, MD  mometasone-formoterol (DULERA) 200-5 MCG/ACT AERO Inhale 2 puffs into the lungs 2 (two) times daily. Patient not taking: Reported on 06/28/2022 05/19/16   Baxter Hire, MD  montelukast (SINGULAIR) 10 MG tablet  TAKE ONE EACH EVENING TO PREVENT COUGH OR WHEEZE. Patient not taking: Reported on 05/22/2016 12/04/15   Baxter Hire, MD  Olopatadine HCl (PAZEO) 0.7 % SOLN Apply 1 drop to eye daily. As needed for itchy eyes. Patient not taking: Reported on 05/19/2016 04/05/16   Jessica Priest, MD  pimecrolimus (ELIDEL) 1 % cream Apply once daily to red rash areas as needed. Patient not taking: Reported on 06/28/2022 12/04/15   Baxter Hire, MD  tiZANidine (ZANAFLEX) 4 MG tablet Take 1  tablet by mouth every 4 (four) hours as needed. Reported on 05/19/2016 Patient not taking: Reported on 06/28/2022 11/17/14   [provider]  VENTOLIN HFA 108 (90 Base) MCG/ACT inhaler Inhale 2 puffs into the lungs every 4 (four) hours as needed for wheezing or shortness of breath. 06/28/22   Kozlow, Alvira Philips, MD  XOLAIR 150 MG injection RECONSTITUTE AS DIRECTED INJECT 375 MG SUBCUTANEOUSLY EVERY 2 WEEKS Patient not taking: Reported on 06/28/2022 08/26/15   Jessica Priest, MD      Allergies    Other, Aspirin, Gabapentin, and Hydromorphone    Review of Systems   Review of Systems  Constitutional:  Negative for fever.  Respiratory:  Negative for shortness of breath and wheezing.   Cardiovascular:  Negative for chest pain.  Gastrointestinal:  Negative for abdominal pain, diarrhea, nausea and vomiting.  Genitourinary:  Positive for vaginal bleeding. Negative for dysuria.  Musculoskeletal:  Negative for back pain.  Skin:  Positive for color change and rash.    Physical Exam Updated Vital Signs BP (!) 147/83   Pulse 64   Temp 97.8 F (36.6 C) (Oral)   Resp 17   Ht 5\' 6"  (1.676 m)   Wt 73.7 kg   LMP 08/04/2022   SpO2 100%   BMI 26.22 kg/m  Physical Exam Vitals and nursing note reviewed.  Constitutional:      General: She is not in acute distress.    Appearance: She is not ill-appearing.  HENT:     Head: Atraumatic.  Eyes:     Conjunctiva/sclera: Conjunctivae normal.  Neck:      Comments: Skin overlying the anterior neck is erythematous, slightly tender to palpation.  No obvious wounds or abrasions. Cardiovascular:     Rate and Rhythm: Normal rate and regular rhythm.     Pulses: Normal pulses.     Heart sounds: No murmur heard. Pulmonary:     Effort: Pulmonary effort is normal. No respiratory distress.     Breath sounds: Normal breath sounds.  Abdominal:     General: Abdomen is flat. There is no distension.     Palpations: Abdomen is soft.     Tenderness: There is  abdominal tenderness.     Comments: Mild left-sided pelvic tenderness to palpation.  Abdomen is otherwise soft and nondistended.  Negative CVA tenderness bilaterally.  No guarding or peritoneal signs.  Musculoskeletal:        General: Normal range of motion.     Cervical back: Normal range of motion.  Skin:    General: Skin is warm and dry.     Capillary Refill: Capillary refill takes less than 2 seconds.  Neurological:     General: No focal deficit present.     Mental Status: She is alert.  Psychiatric:        Mood and Affect: Mood normal.    ED Results / Procedures / Treatments   Labs (all labs ordered are listed, but only abnormal results  are displayed) Labs Reviewed  BASIC METABOLIC PANEL - Abnormal; Notable for the following components:      Result Value   Calcium 8.7 (*)    Anion gap 4 (*)    All other components within normal limits  CBC WITH DIFFERENTIAL/PLATELET - Abnormal; Notable for the following components:   RBC 3.85 (*)    Hemoglobin 10.7 (*)    HCT 34.9 (*)    All other components within normal limits    EKG None  Radiology US Transvaginal Non-OB  Result Date: 08/04/2022 CLINICAL DATA:  Menorrhagia EXAM: TRANSABDOMINAL AND TRANSVAGINAL ULTRASOUND OF PELVIS TECHNIQUE: Both transabdominal and transvaginal ultrasound examinations of the pelvis were performed. Transabdominal technique was performed for global imaging of the pelvis including uterus, ovaries, adnexal regions, and pelvic cul-de-sac. It was necessary to proceed with endovaginal exam following the transabdominal exam to visualize the endometrium and ovaries. COMPARISON:  CT done on 01/01/2015 FINDINGS: Uterus Measurements: 10.3 x 5.3 x 6.3 cm = volume: 178.27 mL. No fibroids or other mass visualized. Endometrium Thickness: 8 mm.  No focal abnormality visualized. Right ovary Measurements: 3.5 x 1.7 x 3.3 cm = volume: 10.11 mL. Normal appearance/no adnexal mass. Left ovary Measurements: 4.9 x 3.2 x 4.6 cm =  volume: 38.18 mL. There are few simple appearing cysts largest measuring 2.9 cm in maximum diameter. Other findings No abnormal free fluid. IMPRESSION: No significant focal abnormalities are seen in the uterus. There are multiple cysts/follicles in left ovary largest measuring 2.9 cm. Electronically Signed   By: Ernie Avena M.D.   On: 08/04/2022 13:54   US Pelvis Complete  Result Date: 08/04/2022 CLINICAL DATA:  Menorrhagia EXAM: TRANSABDOMINAL AND TRANSVAGINAL ULTRASOUND OF PELVIS TECHNIQUE: Both transabdominal and transvaginal ultrasound examinations of the pelvis were performed. Transabdominal technique was performed for global imaging of the pelvis including uterus, ovaries, adnexal regions, and pelvic cul-de-sac. It was necessary to proceed with endovaginal exam following the transabdominal exam to visualize the endometrium and ovaries. COMPARISON:  CT done on 01/01/2015 FINDINGS: Uterus Measurements: 10.3 x 5.3 x 6.3 cm = volume: 178.27 mL. No fibroids or other mass visualized. Endometrium Thickness: 8 mm.  No focal abnormality visualized. Right ovary Measurements: 3.5 x 1.7 x 3.3 cm = volume: 10.11 mL. Normal appearance/no adnexal mass. Left ovary Measurements: 4.9 x 3.2 x 4.6 cm = volume: 38.18 mL. There are few simple appearing cysts largest measuring 2.9 cm in maximum diameter. Other findings No abnormal free fluid. IMPRESSION: No significant focal abnormalities are seen in the uterus. There are multiple cysts/follicles in left ovary largest measuring 2.9 cm. Electronically Signed   By: Ernie Avena M.D.   On: 08/04/2022 13:37    Procedures Procedures    Medications Ordered in ED Medications  dexamethasone (DECADRON) injection 10 mg (10 mg Intramuscular Given 08/04/22 1439)    ED Course/ Medical Decision Making/ A&P                           Medical Decision Making Amount and/or Complexity of Data Reviewed Labs: ordered. Radiology: ordered.  Risk Prescription drug  management.   Social determinants of health:  Social History   Socioeconomic History   Marital status: Single    Spouse name: Not on file   Number of children: Not on file   Years of education: Not on file   Highest education level: Not on file  Occupational History   Not on file  Tobacco Use  Smoking status: Never   Smokeless tobacco: Never  Substance and Sexual Activity   Alcohol use: No   Drug use: No   Sexual activity: Not on file  Other Topics Concern   Not on file  Social History Narrative   Not on file   Social Determinants of Health   Financial Resource Strain: Not on file  Food Insecurity: Not on file  Transportation Needs: Not on file  Physical Activity: Not on file  Stress: Not on file  Social Connections: Not on file  Intimate Partner Violence: Not on file     Initial impression:  This patient presents to the ED for concern of vaginal bleeding and skin irritation over the neck, this involves an extensive number of treatment options, and is a complaint that carries with it a high risk of complications and morbidity.   Differentials include uterine fibroid, endometriosis, menstrual cycle, anemia.  Differentials for skin changes include cellulitis, rash, eczema  Comorbidities affecting care:  Per HPI  Additional history obtained: Asthma and allergy records  Lab Tests  I Ordered, reviewed, and interpreted labs and EKG.  The pertinent results include:  BMP: Normal CBC: Hemoglobin 10.7, near patient's baseline  Imaging Studies ordered:  I ordered imaging studies including  Pelvic ultrasound without acute findings.  Numerous cysts noted to the left ovary I independently visualized and interpreted imaging and I agree with the radiologist interpretation.     Medicines ordered and prescription drug management:  I ordered medication including: Decadron 10 mg IM I have reviewed the patients home medicines and have made adjustments as needed   ED  Course/Re-evaluation: Presents in no acute distress and is nontoxic-appearing.  Vitals without significant abnormality.  On exam, she has some mild left pelvic tenderness to palpation.  Abdomen is otherwise soft and nondistended.  Across the anterior aspect of her neck, the skin is grossly erythematous and slightly tender without evidence of abrasion or injury.  Labs reassuring without acute anemia.  Hemoglobin of 10.7 which is near her baseline.  For heavy bleeding and left pelvic tenderness I ordered a pelvic ultrasound which was ultimately benign.  There were some cyst noted on the left ovary which have been noted previously.  Concern for torsion low.  I considered cellulitis for the redness across her neck, however given her significant history of eczema and atopic dermatitis, patient states that this is similar to previous flareups.  She is given Decadron 10 mg IM.  Advised to follow-up with her allergy doctor.  Also advised to follow-up with OB/GYN regarding heavy menstrual cycles and ovarian cyst.  Patient presses understanding and is amenable to plan.  Disposition:  After consideration of the diagnostic results, physical exam, history and the patients response to treatment feel that the patent would benefit from discharge.   Left ovarian cyst Atopic dermatitis: Plan and management as described above. Discharged home in good condition.  Final Clinical Impression(s) / ED Diagnoses Final diagnoses:  Left ovarian cyst  Atopic dermatitis, unspecified type    Rx / DC Orders ED Discharge Orders     None         Janell Quiet, PA-C 08/04/22 1522    Loetta Rough, MD 08/04/22 1542

## 2022-08-09 ENCOUNTER — Ambulatory Visit
Admission: RE | Admit: 2022-08-09 | Discharge: 2022-08-09 | Disposition: A | Discharge status: Home / Self Care | Payer: Medicare Other | Source: Ambulatory Visit | Attending: Emergency Medicine | Admitting: Emergency Medicine

## 2022-08-09 VITALS — BP 115/80 | HR 73 | Temp 98.4°F | Resp 17

## 2022-08-09 DIAGNOSIS — J4550 Severe persistent asthma, uncomplicated: Secondary | ICD-10-CM | POA: Diagnosis not present

## 2022-08-09 DIAGNOSIS — L209 Atopic dermatitis, unspecified: Secondary | ICD-10-CM | POA: Diagnosis not present

## 2022-08-09 DIAGNOSIS — J309 Allergic rhinitis, unspecified: Secondary | ICD-10-CM

## 2022-08-09 MED ORDER — MONTELUKAST SODIUM 10 MG PO TABS: 10.0000 mg | ORAL_TABLET | Freq: Every day | ORAL | 0 refills | Status: DC

## 2022-08-09 MED ORDER — FLUOCINOLONE ACETONIDE 0.01 % OT OIL: 5.0000 [drp] | TOPICAL_OIL | Freq: Two times a day (BID) | OTIC | 0 refills | Status: DC | PRN

## 2022-08-09 MED ORDER — TRIAMCINOLONE ACETONIDE 40 MG/ML IJ SUSP
60.0000 mg | Freq: Once | INTRAMUSCULAR | Status: AC
  Administered 2022-08-09: 60 mg via INTRAMUSCULAR

## 2022-08-09 MED ORDER — FLUTICASONE PROPIONATE 50 MCG/ACT NA SUSP: 1.0000 | Freq: Every day | NASAL | 2 refills | Status: AC

## 2022-08-09 MED ORDER — METHYLPREDNISOLONE 4 MG PO TBPK: ORAL_TABLET | ORAL | 0 refills | Status: DC

## 2022-08-09 MED ORDER — FEXOFENADINE HCL 180 MG PO TABS: 180.0000 mg | ORAL_TABLET | Freq: Every morning | ORAL | 0 refills | Status: DC

## 2022-08-09 MED ORDER — TRELEGY ELLIPTA 100-62.5-25 MCG/ACT IN AEPB: 1.0000 | INHALATION_SPRAY | Freq: Every morning | RESPIRATORY_TRACT | 0 refills | Status: AC

## 2022-08-09 MED ORDER — OLOPATADINE HCL 0.2 % OP SOLN: 1.0000 [drp] | Freq: Every day | OPHTHALMIC | 1 refills | Status: DC

## 2022-08-09 MED ORDER — CETIRIZINE HCL 10 MG PO TABS: 10.0000 mg | ORAL_TABLET | Freq: Every day | ORAL | 0 refills | Status: DC

## 2022-08-09 NOTE — ED Triage Notes (Signed)
C/o allergic reaction around neck. Went to urgent care Friday and received shot. itchy burning redness inflammation  Started Wednesday 13th    Home remedies: Benadryl

## 2022-08-09 NOTE — Discharge Instructions (Addendum)
The steroid injection that you received at [facility] emergency room on September 14 was Decadron.  The injection we gave you today is Kenalog and it lasts much longer than Decadron does.  I sent a prescription for a Medrol Dosepak to your pharmacy.  If at all possible, please take the first row of tablets all at once with food this morning.  Continue taking each route daily until you have completed all rows.  Please keep in mind that your acute skin reactions are the result of uncontrolled allergies that affect other parts of your body.  You may not have excessive tears from your eyes, a constantly runny nose, deep thick obstructive cough, all of these areas of your body stay in a constant state of inflammation which is manifesting, or playing out in your skin with these acute, itchy rashes.    The key to keeping your skin under control is to keep everything else under control.  I know that you have tried all these medications in the past and none of them on their own have been effective on their own but I can assure you that if you are consistent with taking all of them together, you will get meaningful relief which, while not perfect, will make things more tolerable while you are waiting for Dupixent to take full effect.    I have sent prescriptions for the following allergic urticaria cocktail:  Zyrtec (cetirizine): This is an excellent second-generation antihistamine that helps to reduce respiratory inflammatory response to environmental allergens.  In some patients, this medication can cause daytime sleepiness so I recommend that you take 1 tablet daily at bedtime.    Allegra (fexofenadine): This is an excellent second-generation antihistamine that helps to reduce respiratory inflammatory response to environmental allergens.  This medication is not known to cause daytime sleepiness so it can be taken in the morning.    Singulair (montelukast): This is a mast cell stabilizer that works well with  antihistamines.  Mast cells are responsible for stimulating histamine production so you can imagine that if we can reduce the activity of your mast cells, then fewer histamines will be produced and inflammation caused by allergy exposure will be significantly reduced.  I recommend that you take this medication at the same time you take your antihistamine.  Flonase (fluticasone): This is a steroid nasal spray that you use once daily, 1 spray in each nare.  This medication does not work well if you decide to use it only used as you feel you need to, it works best used on a daily basis.  After 3 to 5 days of use, you will notice significant reduction of the inflammation and mucus production that is currently being caused by exposure to allergens, whether seasonal or environmental.      Trelegy (fluticasone, vilanterol and umeclidinium):  This inhaled medication contains a corticosteroid and long-acting form of albuterol.  The inhaled steroid and this medication  is not absorbed into the body and will not cause side effects such as increased blood sugar levels, irritability, sleeplessness or weight gain.  Inhaled corticosteroid are sort of like topical steroid creams but, as you can imagine, it is not practical to attempt to rub a steroid cream inside of your lungs.  The long-acting albuterol works similarly to the short acting albuterol found in your rescue inhaler but provides 24-hour relaxation of the smooth muscles that open and constrict your airways; your short acting rescue inhaler can only provide for a few hours this  benefit for a few hours.  The third unique ingredient, umeclidinium, is an antimuscarinic and works similarly to your albuterol, providing long-acting relaxation of the smooth muscles in your airway.  Please feel free to continue using your short acting rescue inhaler as often as needed throughout the day for shortness of breath, wheezing, and cough.  Pataday (olopatadine): This is an  antihistamine eyedrop that can be used once daily to help relieve dry eyes, itchy eyes and red eyes.  This antihistamine drop not only works for allergic conjunctivitis but is also very helpful with viral conjunctivitis.  Please do not use this drop more than once a day, for best relief please use this in the morning.    Advil, Motrin (ibuprofen): This is a good anti-inflammatory medication which not only addresses aches, pains but also significantly reduces skin and soft tissue inflammation of the upper airways that causes sinus and nasal congestion as well as inflammation of the lower airways which makes you feel like your breathing is constricted or your cough feel tight.  I recommend that you take between 400 mg every 8 hours as needed.     Fortunately, you have a light at the end of your tunnel with your Dupixent.  I hope that insurance company never puts you through this again.  Thank you for visiting urgent care today and trusting [place] with your care.  I wish you all the best.

## 2022-08-09 NOTE — ED Provider Notes (Signed)
[facility]    CSN: [id number] Arrival date & time: 08/09/22  4098    HISTORY   Chief Complaint  Patient presents with   Allergic Reaction   appt [id number]   HPI Danielle Bass is a pleasant, 46 y.o. female who presents to urgent care today. Patient presents to urgent care complaining of urticaria around her neck and upper chest.  Patient states she went to the emergency room on September 14 where she received a steroid injection that only provided her with relief for a few hours.  Patient states she has a history of severe atopic dermatitis, was previously on Dupixent for this but when her insurance changed she had a lapse in treatment.  Patient states she is currently restarted on Dupixent but has only had her first dose and still having symptoms well waiting for Dupixent to take full effect.  Patient is requesting relief of her symptoms today.  Patient states she has tried and failed Zyrtec, Singulair, Flonase, Dulera, Pataday but admits that she is never taken all of these at the same time.  Patient states she does best with steroids for relief of symptoms.  The history is provided by the patient.   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   OB History   No obstetric history on file.    Home Medications    Prior to Admission medications   Medication Sig Start Date End Date Taking? Authorizing Provider  cetirizine (ZYRTEC ALLERGY) 10 MG tablet Take 1 tablet (10 mg total) by mouth at bedtime. 08/09/22 09/08/22 Yes Theadora Rama Scales, PA-C  fexofenadine (ALLEGRA) 180 MG tablet Take 1 tablet (180 mg total) by mouth in the morning. 08/09/22 09/08/22 Yes Theadora Rama Scales, PA-C  fluticasone (FLONASE) 50 MCG/ACT nasal spray Place 1 spray into both nostrils daily. Begin by using 2 sprays in each nare daily for 3 to 5 days, then decrease to 1 spray in each nare daily. 08/09/22  Yes Theadora Rama Scales, PA-C  Fluticasone-Umeclidin-Vilant (TRELEGY ELLIPTA) 100-62.5-25 MCG/ACT AEPB Inhale 1 puff into the lungs in the morning for 14 days. 08/09/22 08/23/22 Yes Theadora Rama Scales, PA-C  methylPREDNISolone (MEDROL DOSEPAK) 4 MG TBPK tablet Take 24 mg on day 1, 20 mg on day 2, 16 mg on day 3, 12 mg on day 4, 8 mg on day 5, 4 mg on day 6.  Take all tablets in each row at once, do not spread tablets out throughout the day. 08/09/22  Yes Lequita Halt,  Raphaella Larkin Scales, PA-C  montelukast (SINGULAIR) 10 MG tablet Take 1 tablet (10 mg total) by mouth at bedtime. 08/09/22 09/08/22 Yes Theadora RamaMorgan, Wilberta Dorvil Scales, PA-C  Olopatadine HCl (PATADAY) 0.2 % SOLN Apply 1 drop to eye daily. 08/09/22  Yes Theadora RamaMorgan, Solly Derasmo Scales, PA-C  albuterol (PROVENTIL) (2.5 MG/3ML) 0.083% nebulizer solution Take 2.5 mg by nebulization every 4 (four) hours as needed for wheezing or shortness of breath.    [provider]  EPINEPHrine (EPIPEN 2-PAK) 0.3 mg/0.3 mL IJ SOAJ injection Inject 0.3 mg into the muscle as needed. 06/28/22   Kozlow, Alvira PhilipsEric J, MD  VENTOLIN HFA 108 (90  Base) MCG/ACT inhaler Inhale 2 puffs into the lungs every 4 (four) hours as needed for wheezing or shortness of breath. 06/28/22   Kozlow, Alvira PhilipsEric J, MD    Family History Family History  Problem Relation Age of Onset   Macular degeneration Mother    Social History Social History   Tobacco Use   Smoking status: Never   Smokeless tobacco: Never  Substance Use Topics   Alcohol use: No   Drug use: No   Allergies   Other, Aspirin, Gabapentin, and Hydromorphone  Review of Systems Review of Systems Pertinent findings revealed after performing a 14 point review of systems has been noted in the history of present illness.  Physical Exam Triage Vital Signs ED Triage Vitals  Enc Vitals Group     BP 09/17/21 0827 (!) 147/82     Pulse Rate 09/17/21 0827 72     Resp 09/17/21 0827 18     Temp 09/17/21 0827 98.3 F (36.8 C)     Temp Source 09/17/21 0827 Oral     SpO2 09/17/21 0827 98 %     Weight --      Height --      Head Circumference --      Peak Flow --      Pain Score 09/17/21 0826 5     Pain Loc --      Pain Edu? --      Excl. in GC? --   No data found.  Updated Vital Signs BP 115/80 (BP Location: Right Arm)   Pulse 73   Temp 98.4 F (36.9 C) (Oral)   Resp 17   LMP 08/04/2022   SpO2 98%   Physical Exam Vitals and nursing note reviewed.  Constitutional:      General: She is not in acute distress.    Appearance: Normal appearance. She is not ill-appearing.  HENT:     Head: Normocephalic and atraumatic.     Salivary Glands: Right salivary gland is not diffusely enlarged or tender. Left salivary gland is not diffusely enlarged or tender.     Right Ear: Ear canal and external ear normal. No drainage. A middle ear effusion is present. There is no impacted cerumen. Tympanic membrane is bulging. Tympanic membrane is not injected or erythematous.     Left Ear: Ear canal and external ear normal. No drainage. A middle ear effusion is present. There is no impacted cerumen.  Tympanic membrane is bulging. Tympanic membrane is not injected or erythematous.     Ears:     Comments: Bilateral EACs normal, both TMs bulging with clear fluid    Nose: Rhinorrhea present. No nasal deformity, septal deviation, signs of injury, nasal tenderness, mucosal edema or congestion. Rhinorrhea is clear.     Right Nostril: Occlusion present. No foreign body, epistaxis or septal hematoma.     Left Nostril: Occlusion  present. No foreign body, epistaxis or septal hematoma.     Right Turbinates: Enlarged, swollen and pale.     Left Turbinates: Enlarged, swollen and pale.     Right Sinus: No maxillary sinus tenderness or frontal sinus tenderness.     Left Sinus: No maxillary sinus tenderness or frontal sinus tenderness.     Mouth/Throat:     Lips: Pink. No lesions.     Mouth: Mucous membranes are moist. No oral lesions.     Pharynx: Oropharynx is clear. Uvula midline. No posterior oropharyngeal erythema or uvula swelling.     Tonsils: No tonsillar exudate. 0 on the right. 0 on the left.     Comments: Postnasal drip Eyes:     General: Lids are normal.        Right eye: No discharge.        Left eye: No discharge.     Extraocular Movements: Extraocular movements intact.     Conjunctiva/sclera: Conjunctivae normal.     Right eye: Right conjunctiva is not injected.     Left eye: Left conjunctiva is not injected.  Neck:     Trachea: Trachea and phonation normal.  Cardiovascular:     Rate and Rhythm: Normal rate and regular rhythm.     Pulses: Normal pulses.     Heart sounds: Normal heart sounds. No murmur heard.    No friction rub. No gallop.  Pulmonary:     Effort: Pulmonary effort is normal. No accessory muscle usage, prolonged expiration or respiratory distress.     Breath sounds: Normal breath sounds. No stridor, decreased air movement or transmitted upper airway sounds. No decreased breath sounds, wheezing, rhonchi or rales.  Chest:     Chest wall: No tenderness.   Musculoskeletal:        General: Normal range of motion.     Cervical back: Normal range of motion and neck supple. Normal range of motion.  Lymphadenopathy:     Cervical: No cervical adenopathy.  Skin:    General: Skin is warm and dry.     Findings: Rash (Urticarial rash around upper chest and neck without signs of excoriation or superficial infection.) present. No erythema.  Neurological:     General: No focal deficit present.     Mental Status: She is alert and oriented to person, place, and time.  Psychiatric:        Mood and Affect: Mood normal.        Behavior: Behavior normal.     Visual Acuity Right Eye Distance:   Left Eye Distance:   Bilateral Distance:    Right Eye Near:   Left Eye Near:    Bilateral Near:     UC Couse / Diagnostics / Procedures:     Radiology No results found.  Procedures Procedures (including critical care time) EKG  Pending results:  Labs Reviewed - No data to display  Medications Ordered in UC: Medications  triamcinolone acetonide (KENALOG-40) injection 60 mg (60 mg Intramuscular Given 08/09/22 0850)    UC Diagnoses / Final Clinical Impressions(s)   I have reviewed the triage vital signs and the nursing notes.  Pertinent labs & imaging results that were available during my care of the patient were reviewed by me and considered in my medical decision making (see chart for details).    Final diagnoses:  Atopic dermatitis, unspecified type  Severe persistent extrinsic asthma without complication  Allergic rhinitis, unspecified seasonality, unspecified trigger   Patient provided with a steroid injection  and a tapering dose of methylprednisolone to help calm her skin.  Multimodal approach to managing inflammatory symptoms prescribed while patient is waiting for Dupixent to take full effect.  Patient advised that she is welcome to return for repeat evaluation if allergy rash up again.  ED Prescriptions     Medication Sig Dispense  Auth. Provider   fexofenadine (ALLEGRA) 180 MG tablet Take 1 tablet (180 mg total) by mouth in the morning. 30 tablet Theadora Rama Scales, PA-C   cetirizine (ZYRTEC ALLERGY) 10 MG tablet Take 1 tablet (10 mg total) by mouth at bedtime. 30 tablet Theadora Rama Scales, PA-C   montelukast (SINGULAIR) 10 MG tablet Take 1 tablet (10 mg total) by mouth at bedtime. 30 tablet Theadora Rama Scales, PA-C   fluticasone (FLONASE) 50 MCG/ACT nasal spray Place 1 spray into both nostrils daily. Begin by using 2 sprays in each nare daily for 3 to 5 days, then decrease to 1 spray in each nare daily. 15.8 mL Theadora Rama Scales, PA-C   methylPREDNISolone (MEDROL DOSEPAK) 4 MG TBPK tablet Take 24 mg on day 1, 20 mg on day 2, 16 mg on day 3, 12 mg on day 4, 8 mg on day 5, 4 mg on day 6.  Take all tablets in each row at once, do not spread tablets out throughout the day. 21 tablet Theadora Rama Scales, PA-C   Fluticasone-Umeclidin-Vilant (TRELEGY ELLIPTA) 100-62.5-25 MCG/ACT AEPB Inhale 1 puff into the lungs in the morning for 14 days. 1 each Theadora Rama Scales, PA-C   Olopatadine HCl (PATADAY) 0.2 % SOLN Apply 1 drop to eye daily. 2.5 mL Theadora Rama Scales, PA-C   Fluocinolone Acetonide (DERMOTIC) 0.01 % OIL Place 5 drops in ear(s) 2 (two) times daily as needed (Itching in ears). 20 mL Theadora Rama Scales, PA-C      PDMP not reviewed this encounter.  Pending results:  Labs Reviewed - No data to display  Discharge Instructions:   Discharge Instructions      The steroid injection that you received at [facility] emergency room on September 14 was Decadron.  The injection we gave you today is Kenalog and it lasts much longer than Decadron does.  I sent a prescription for a Medrol Dosepak to your pharmacy.  If at all possible, please take the first row of tablets all at once with food this morning.  Continue taking each route daily until you have completed all rows.  Please keep in mind  that your acute skin reactions are the result of uncontrolled allergies that affect other parts of your body.  You may not have excessive tears from your eyes, a constantly runny nose, deep thick obstructive cough, all of these areas of your body stay in a constant state of inflammation which is manifesting, or playing out in your skin with these acute, itchy rashes.    The key to keeping your skin under control is to keep everything else under control.  I know that you have tried all these medications in the past and none of them on their own have been effective on their own but I can assure you that if you are consistent with taking all of them together, you will get meaningful relief which, while not perfect, will make things more tolerable while you are waiting for Dupixent to take full effect.    I have sent prescriptions for the following allergic urticaria cocktail:  Zyrtec (cetirizine): This is an excellent second-generation antihistamine that helps to reduce  respiratory inflammatory response to environmental allergens.  In some patients, this medication can cause daytime sleepiness so I recommend that you take 1 tablet daily at bedtime.    Allegra (fexofenadine): This is an excellent second-generation antihistamine that helps to reduce respiratory inflammatory response to environmental allergens.  This medication is not known to cause daytime sleepiness so it can be taken in the morning.    Singulair (montelukast): This is a mast cell stabilizer that works well with antihistamines.  Mast cells are responsible for stimulating histamine production so you can imagine that if we can reduce the activity of your mast cells, then fewer histamines will be produced and inflammation caused by allergy exposure will be significantly reduced.  I recommend that you take this medication at the same time you take your antihistamine.  Flonase (fluticasone): This is a steroid nasal spray that you use once daily,  1 spray in each nare.  This medication does not work well if you decide to use it only used as you feel you need to, it works best used on a daily basis.  After 3 to 5 days of use, you will notice significant reduction of the inflammation and mucus production that is currently being caused by exposure to allergens, whether seasonal or environmental.      Trelegy (fluticasone, vilanterol and umeclidinium):  This inhaled medication contains a corticosteroid and long-acting form of albuterol.  The inhaled steroid and this medication  is not absorbed into the body and will not cause side effects such as increased blood sugar levels, irritability, sleeplessness or weight gain.  Inhaled corticosteroid are sort of like topical steroid creams but, as you can imagine, it is not practical to attempt to rub a steroid cream inside of your lungs.  The long-acting albuterol works similarly to the short acting albuterol found in your rescue inhaler but provides 24-hour relaxation of the smooth muscles that open and constrict your airways; your short acting rescue inhaler can only provide for a few hours this benefit for a few hours.  The third unique ingredient, umeclidinium, is an antimuscarinic and works similarly to your albuterol, providing long-acting relaxation of the smooth muscles in your airway.  Please feel free to continue using your short acting rescue inhaler as often as needed throughout the day for shortness of breath, wheezing, and cough.  Pataday (olopatadine): This is an antihistamine eyedrop that can be used once daily to help relieve dry eyes, itchy eyes and red eyes.  This antihistamine drop not only works for allergic conjunctivitis but is also very helpful with viral conjunctivitis.  Please do not use this drop more than once a day, for best relief please use this in the morning.    Advil, Motrin (ibuprofen): This is a good anti-inflammatory medication which not only addresses aches, pains but also  significantly reduces skin and soft tissue inflammation of the upper airways that causes sinus and nasal congestion as well as inflammation of the lower airways which makes you feel like your breathing is constricted or your cough feel tight.  I recommend that you take between 400 mg every 8 hours as needed.     Fortunately, you have a light at the end of your tunnel with your Dupixent.  I hope that insurance company never puts you through this again.  Thank you for visiting urgent care today and trusting [place] with your care.  I wish you all the best.       Disposition Upon Discharge:  Condition:  stable for discharge home  Patient presented with an acute illness with associated systemic symptoms and significant discomfort requiring urgent management. In my opinion, this is a condition that a prudent lay person (someone who possesses an average knowledge of health and medicine) may potentially expect to result in complications if not addressed urgently such as respiratory distress, impairment of bodily function or dysfunction of bodily organs.   Routine symptom specific, illness specific and/or disease specific instructions were discussed with the patient and/or caregiver at length.   As such, the patient has been evaluated and assessed, work-up was performed and treatment was provided in alignment with urgent care protocols and evidence based medicine.  Patient/parent/caregiver has been advised that the patient may require follow up for further testing and treatment if the symptoms continue in spite of treatment, as clinically indicated and appropriate.  Patient/parent/caregiver has been advised to return to the [facility] or PCP if no better; to PCP or the Emergency Department if new signs and symptoms develop, or if the current signs or symptoms continue to change or worsen for further workup, evaluation and treatment as clinically indicated and appropriate  The patient will follow up with their  current PCP if and as advised. If the patient does not currently have a PCP we will assist them in obtaining one.   The patient may need specialty follow up if the symptoms continue, in spite of conservative treatment and management, for further workup, evaluation, consultation and treatment as clinically indicated and appropriate.   Patient/parent/caregiver verbalized understanding and agreement of plan as discussed.  All questions were addressed during visit.  Please see discharge instructions below for further details of plan.  This office note has been dictated using Teaching laboratory technician.  Unfortunately, this method of dictation can sometimes lead to typographical or grammatical errors.  I apologize for your inconvenience in advance if this occurs.  Please do not hesitate to reach out to me if clarification is needed.      Theadora Rama Scales, PA-C 08/10/22 1217

## 2022-09-07 ENCOUNTER — Ambulatory Visit
Admission: RE | Admit: 2022-09-07 | Discharge: 2022-09-07 | Disposition: A | Discharge status: Home / Self Care | Payer: Medicare Other | Source: Ambulatory Visit | Attending: Urgent Care | Admitting: Urgent Care

## 2022-09-07 VITALS — BP 151/89 | HR 62 | Temp 97.8°F | Resp 16

## 2022-09-07 DIAGNOSIS — H01134 Eczematous dermatitis of left upper eyelid: Secondary | ICD-10-CM | POA: Diagnosis not present

## 2022-09-07 DIAGNOSIS — H01132 Eczematous dermatitis of right lower eyelid: Secondary | ICD-10-CM

## 2022-09-07 DIAGNOSIS — H01135 Eczematous dermatitis of left lower eyelid: Secondary | ICD-10-CM

## 2022-09-07 DIAGNOSIS — H01131 Eczematous dermatitis of right upper eyelid: Secondary | ICD-10-CM | POA: Diagnosis not present

## 2022-09-07 MED ORDER — HYDROXYZINE HCL 25 MG PO TABS: 12.5000 mg | ORAL_TABLET | Freq: Three times a day (TID) | ORAL | 0 refills | Status: DC | PRN

## 2022-09-07 MED ORDER — PREDNISONE 20 MG PO TABS: ORAL_TABLET | ORAL | 0 refills | Status: DC

## 2022-09-07 NOTE — ED Provider Notes (Signed)
[facility]  Note:  This document was prepared using Systems analyst and may include unintentional dictation errors.  MRN: [id number] DOB: 03/23/1976  Subjective:   Danielle Bass is a 46 y.o. female presenting for 1 day history of recurrent pruritic and swollen rash about the face and eyelids.  Patient has a history of difficulty with atopic dermatitis.  Has previously tried many creams and refuses to take any more.  The last 1 caused a lot of hypopigmentation forearms.  Patient does much better with oral prednisone.  No current facility-administered medications for this encounter.  Current Outpatient Medications:    albuterol (PROVENTIL) (2.5 MG/3ML) 0.083% nebulizer solution, Take 2.5 mg by nebulization every 4 (four) hours as needed for wheezing or shortness of breath., Disp: , Rfl:    cetirizine (ZYRTEC ALLERGY) 10 MG tablet, Take 1 tablet (10 mg total) by mouth at bedtime., Disp: 30 tablet, Rfl: 0   EPINEPHrine (EPIPEN 2-PAK) 0.3 mg/0.3 mL IJ SOAJ injection, Inject 0.3 mg into the muscle as needed., Disp: 2 each, Rfl: 2   fexofenadine (ALLEGRA) 180 MG tablet, Take 1 tablet (180 mg total) by mouth in the morning., Disp: 30 tablet, Rfl: 0   Fluocinolone Acetonide (DERMOTIC) 0.01 % OIL, Place 5 drops in ear(s) 2 (two) times daily as needed (Itching in ears)., Disp: 20 mL, Rfl: 0   fluticasone (FLONASE) 50 MCG/ACT nasal spray, Place 1 spray into both nostrils daily. Begin by using 2 sprays in each nare daily for 3 to 5 days, then decrease to 1 spray in each nare daily., Disp: 15.8 mL, Rfl: 2   methylPREDNISolone (MEDROL DOSEPAK) 4 MG TBPK tablet, Take 24 mg on day 1, 20 mg on day 2, 16 mg on day 3, 12 mg on day 4, 8 mg on day 5, 4 mg on day 6.  Take all tablets in each row at once, do not spread tablets out throughout the day., Disp: 21 tablet, Rfl: 0   montelukast (SINGULAIR) 10 MG tablet, Take 1 tablet (10 mg total) by mouth at bedtime., Disp: 30  tablet, Rfl: 0   Olopatadine HCl (PATADAY) 0.2 % SOLN, Apply 1 drop to eye daily., Disp: 2.5 mL, Rfl: 1   VENTOLIN HFA 108 (90 Base) MCG/ACT inhaler, Inhale 2 puffs into the lungs every 4 (four) hours as needed for wheezing or shortness of breath., Disp: 18 g, Rfl: 1   Allergies  Allergen Reactions   Other Anaphylaxis    Tree nut, Tomato sauce   Aspirin Other (See Comments)    Other reaction(s): Unknown "doctor doesn't want her to take" "doctor doesn't want her to take"   Gabapentin Itching   Hydromorphone Other (See Comments)    Drop in bp    Past Medical History:  Diagnosis Date   Acid reflux    Asthma    Bronchitis    Chronic back pain    Depression    Eczema      Past Surgical History:  Procedure Laterality Date   LEG SURGERY     tib fib left?    Family History  Problem Relation Age of Onset   Macular degeneration Mother     Social History   Tobacco Use   Smoking status: Never   Smokeless tobacco: Never  Substance Use Topics   Alcohol use: No   Drug use: No    ROS   Objective:   Vitals: BP (!) 151/89 (BP Location: Right Arm)  Pulse 62   Temp 97.8 F (36.6 C) (Oral)   Resp 16   LMP 09/06/2022 (Exact Date)   SpO2 99%   Physical Exam Constitutional:      General: She is not in acute distress.    Appearance: Normal appearance. She is well-developed. She is not ill-appearing, toxic-appearing or diaphoretic.  HENT:     Head: Normocephalic and atraumatic.     Nose: Nose normal.     Mouth/Throat:     Mouth: Mucous membranes are moist.  Eyes:     General: No scleral icterus.       Right eye: No discharge.        Left eye: No discharge.     Extraocular Movements: Extraocular movements intact.     Comments: Dry scaly hyperpigmented pruritic patches about the lower eyelids and to a lesser degree the upper eyelids bilaterally with swelling.  Cardiovascular:     Rate and Rhythm: Normal rate.  Pulmonary:     Effort: Pulmonary effort is normal.   Skin:    General: Skin is warm and dry.  Neurological:     General: No focal deficit present.     Mental Status: She is alert and oriented to person, place, and time.  Psychiatric:        Mood and Affect: Mood normal.        Behavior: Behavior normal.       Assessment and Plan :   PDMP not reviewed this encounter.  1. Eczematous dermatitis of upper and lower eyelids of both eyes     Patient refused any topical treatments.  Offered an oral prednisone course and recommended she follow-up closely with her PCP. Counseled patient on potential for adverse effects with medications prescribed/recommended today, ER and return-to-clinic precautions discussed, patient verbalized understanding.    Wallis Bamberg, PA-C 09/07/22 1616

## 2022-09-07 NOTE — ED Triage Notes (Signed)
Pt states itching and swelling to her face since yesterday. States it is worse in the mornings. States she has been taking Benadryl with no relief.

## 2022-09-13 ENCOUNTER — Ambulatory Visit (INDEPENDENT_AMBULATORY_CARE_PROVIDER_SITE_OTHER): Payer: Medicare Other | Admitting: Allergy and Immunology

## 2022-09-13 VITALS — BP 120/70 | HR 84 | Temp 97.6°F | Resp 18 | Ht 66.0 in | Wt 162.5 lb

## 2022-09-13 DIAGNOSIS — J3089 Other allergic rhinitis: Secondary | ICD-10-CM | POA: Diagnosis not present

## 2022-09-13 DIAGNOSIS — L2089 Other atopic dermatitis: Secondary | ICD-10-CM

## 2022-09-13 DIAGNOSIS — L209 Atopic dermatitis, unspecified: Secondary | ICD-10-CM

## 2022-09-13 DIAGNOSIS — J4550 Severe persistent asthma, uncomplicated: Secondary | ICD-10-CM

## 2022-09-13 MED ORDER — DUPILUMAB 300 MG/2ML ~~LOC~~ SOSY
600.0000 mg | PREFILLED_SYRINGE | Freq: Once | SUBCUTANEOUS | Status: AC
  Administered 2022-09-13: 600 mg via SUBCUTANEOUS

## 2022-09-13 MED ORDER — CETIRIZINE HCL 10 MG PO TABS: 10.0000 mg | ORAL_TABLET | Freq: Every day | ORAL | 1 refills | Status: DC

## 2022-09-13 MED ORDER — EPINEPHRINE 0.3 MG/0.3ML IJ SOAJ: 0.3000 mg | INTRAMUSCULAR | 2 refills | Status: DC | PRN

## 2022-09-13 NOTE — Patient Instructions (Signed)
  1.  Start dupilumab.  600 mg loading dose today and then 300 mg every 2 weeks.  2.  [name] will work through approval for dupilumab administration  3.  If needed:  A. Albuterol HFA - 2 inhalations every 4-6 hours B. Cetirizine 10 mg - 1-2 tablets 1 time per day C. Bath/shower followed by Elidel followed by mometasone 0.1% ointment daily  4. Return to clinic in 8 weeks or earlier if problem

## 2022-09-13 NOTE — Progress Notes (Unsigned)
[place] [facility] [place] [facility] [place]   Follow-up Note  Referring Provider: No ref. provider found Primary Provider: Signa Kell, MD Date of Office Visit: 09/13/2022  Subjective:   Danielle Bass (DOB: 25-Oct-1976) is a 46 y.o. female who returns to the [facility] on 09/13/2022 in re-evaluation of the following:  HPI: Danielle Bass returns to this clinic in evaluation of multiorgan atopic disease including severe atopic dermatitis, asthma, allergic rhinitis, and a history of intermittent urticaria.  I last saw her in this clinic on 28 June 2022.  Because of an insurance issue she has been unable to obtain dupilumab so she only received the loading dose that we gave her during her last visit which resulted in very good control of her skin condition but certainly over the course of the past several weeks she has had return of significant problems around her eyes and her neck.  She has received an injection of steroids from the urgent care for this issue.  Likewise, her asthma and her upper airway issue completely abated after receiving her loading dose of dupilumab during her last visit and she rarely used any short acting bronchodilator.  She has developed some problems with wheezing and coughing over the course of the past several weeks and had to use her short acting bronchodilator.  She does not receive the flu vaccine.  Allergies as of 09/13/2022       Reactions   Other Anaphylaxis   Tree nut, Tomato sauce   Aspirin Other (See Comments)   Other reaction(s): Unknown "doctor doesn't want her to take" "doctor doesn't want her to take"   Gabapentin Itching   Hydromorphone Other (See Comments)   Drop in bp        Medication List    Ventolin HFA 108 (90 Base) MCG/ACT inhaler Generic drug: albuterol Inhale 2 puffs into the lungs every 4 (four) hours as needed for wheezing or shortness of breath.   albuterol (2.5 MG/3ML) 0.083% nebulizer  solution Commonly known as: PROVENTIL Take 2.5 mg by nebulization every 4 (four) hours as needed for wheezing or shortness of breath.   cetirizine 10 MG tablet Commonly known as: ZyrTEC Allergy Take 1 tablet (10 mg total) by mouth at bedtime.   EPINEPHrine 0.3 mg/0.3 mL Soaj injection Commonly known as: EpiPen 2-Pak Inject 0.3 mg into the muscle as needed.   fexofenadine 180 MG tablet Commonly known as: ALLEGRA Take 1 tablet (180 mg total) by mouth in the morning.   Fluocinolone Acetonide 0.01 % Oil Commonly known as: DermOtic Place 5 drops in ear(s) 2 (two) times daily as needed (Itching in ears).   fluticasone 50 MCG/ACT nasal spray Commonly known as: FLONASE Place 1 spray into both nostrils daily. Begin by using 2 sprays in each nare daily for 3 to 5 days, then decrease to 1 spray in each nare daily.   hydrOXYzine 25 MG tablet Commonly known as: ATARAX Take 0.5-1 tablets (12.5-25 mg total) by mouth every 8 (eight) hours as needed for itching.   montelukast 10 MG tablet Commonly known as: Singulair Take 1 tablet (10 mg total) by mouth at bedtime.   Olopatadine HCl 0.2 % Soln Commonly known as: Pataday Apply 1 drop to eye daily.   predniSONE 20 MG tablet Commonly known as: DELTASONE Day 1-5: Take 2 tablets daily. Day 6-10: Take 1 tablet daily. Take tablets with breakfast.      Past Medical History:  Diagnosis Date  Acid reflux    Asthma    Bronchitis    Chronic back pain    Depression    Eczema     Past Surgical History:  Procedure Laterality Date   LEG SURGERY     tib fib left?    Review of systems negative except as noted in HPI / PMHx or noted below:  Review of Systems  Constitutional: Negative.   HENT: Negative.    Eyes: Negative.   Respiratory: Negative.    Cardiovascular: Negative.   Gastrointestinal: Negative.   Genitourinary: Negative.   Musculoskeletal: Negative.   Skin: Negative.   Neurological: Negative.   Endo/Heme/Allergies:  Negative.   Psychiatric/Behavioral: Negative.       Objective:   Vitals:   09/13/22 1143  BP: 120/70  Pulse: 84  Resp: 18  Temp: 97.6 F (36.4 C)  SpO2: 100%   Height: 5\' 6"  (167.6 cm)  Weight: 162 lb 7.7 oz (73.7 kg)   Physical Exam Constitutional:      Appearance: She is not diaphoretic.  HENT:     Head: Normocephalic.     Right Ear: Tympanic membrane, ear canal and external ear normal.     Left Ear: Tympanic membrane, ear canal and external ear normal.     Nose: Nose normal. No mucosal edema or rhinorrhea.     Mouth/Throat:     Pharynx: Uvula midline. No oropharyngeal exudate.  Eyes:     Conjunctiva/sclera: Conjunctivae normal.  Neck:     Thyroid: No thyromegaly.     Trachea: Trachea normal. No tracheal tenderness or tracheal deviation.  Cardiovascular:     Rate and Rhythm: Normal rate and regular rhythm.     Heart sounds: Normal heart sounds, S1 normal and S2 normal. No murmur heard. Pulmonary:     Effort: No respiratory distress.     Breath sounds: Normal breath sounds. No stridor. No wheezing or rales.  Lymphadenopathy:     Head:     Right side of head: No tonsillar adenopathy.     Left side of head: No tonsillar adenopathy.     Cervical: No cervical adenopathy.  Skin:    Findings: Rash (Hyperpigmented indurated slightly erythematous periorbital region and posterior neck.) present. No erythema.     Nails: There is no clubbing.  Neurological:     Mental Status: She is alert.     Diagnostics:    Spirometry was performed and demonstrated an FEV1 of 2.70 at 103 % of predicted.  Assessment and Plan:   1. Other atopic dermatitis   2. Not well controlled severe persistent asthma   3. Other allergic rhinitis   4. Atopic dermatitis, unspecified type    1.  Start dupilumab.  600 mg loading dose today and then 300 mg every 2 weeks.  2.  Danielle Bass will work through approval for dupilumab administration  3.  If needed:  A. Albuterol HFA - 2 inhalations every  4-6 hours B. Cetirizine 10 mg - 1-2 tablets 1 time per day C. Bath/shower followed by Elidel followed by mometasone 0.1% ointment daily  4. Return to clinic in 8 weeks or earlier if problem  Danielle Bass needs to use an anti-IL-4/IL-13 biologic agent to get her very severe atopic dermatitis under control which would limit her exposure to systemic steroids which has been extensive over the course of the past year.  If for some reason her insurance company does not approve this agent then we can move ahead with an oral Jak inhibitor.  Allena Katz, MD All[facility] / [facility] [place]

## 2022-09-14 ENCOUNTER — Encounter: Payer: Self-pay | Admitting: Allergy and Immunology

## 2022-09-22 ENCOUNTER — Telehealth: Payer: Self-pay | Admitting: *Deleted

## 2022-09-22 NOTE — Telephone Encounter (Signed)
Called patient and advised I faxed he app for PAP to [place] for her to go by and sign and bring in her [place] PDP card. Patient acknowledged instructions I gave her

## 2022-09-23 ENCOUNTER — Ambulatory Visit
Admission: EM | Admit: 2022-09-23 | Discharge: 2022-09-23 | Disposition: A | Discharge status: Home / Self Care | Payer: Medicare Other | Attending: Internal Medicine | Admitting: Internal Medicine

## 2022-09-23 ENCOUNTER — Encounter: Payer: Self-pay | Admitting: Emergency Medicine

## 2022-09-23 DIAGNOSIS — B349 Viral infection, unspecified: Secondary | ICD-10-CM | POA: Diagnosis present

## 2022-09-23 DIAGNOSIS — R110 Nausea: Secondary | ICD-10-CM | POA: Diagnosis not present

## 2022-09-23 DIAGNOSIS — R051 Acute cough: Secondary | ICD-10-CM | POA: Diagnosis present

## 2022-09-23 DIAGNOSIS — Z1152 Encounter for screening for COVID-19: Secondary | ICD-10-CM | POA: Insufficient documentation

## 2022-09-23 LAB — RESP PANEL BY RT-PCR (FLU A&B, COVID) ARPGX2
Influenza A by PCR: NEGATIVE
Influenza B by PCR: NEGATIVE
SARS Coronavirus 2 by RT PCR: NEGATIVE

## 2022-09-23 MED ORDER — BENZONATATE 100 MG PO CAPS: 100.0000 mg | ORAL_CAPSULE | Freq: Three times a day (TID) | ORAL | 0 refills | Status: DC | PRN

## 2022-09-23 MED ORDER — ONDANSETRON 4 MG PO TBDP: 4.0000 mg | ORAL_TABLET | Freq: Three times a day (TID) | ORAL | 0 refills | Status: DC | PRN

## 2022-09-23 NOTE — ED Triage Notes (Signed)
Pt is present today with c/o fever, HA, nausea, and slight cough. Pt sx started x2 days ago

## 2022-09-23 NOTE — ED Provider Notes (Addendum)
[name]    CSN: [id number] Arrival date & time: 09/23/22  1456      History   Chief Complaint Chief Complaint  Patient presents with   Headache   Fever   Cough    HPI Danielle Bass is a 46 y.o. female.   Patient presents with fever, headache, nausea, vomiting, mild nonproductive cough that started yesterday.  Denies nasal congestion, runny nose, diarrhea, abdominal pain.  Denies any known sick contacts.  Tmax at home was 103.  Denies chest pain, shortness of breath, sore throat.  Patient has been drinking fluids appropriately.  She has not taken any medications to help alleviate symptoms.   Headache Fever Cough   Past Medical History:  Diagnosis Date   Acid reflux    Asthma    Bronchitis    Chronic back pain    Depression    Eczema     Patient Active Problem List   Diagnosis Date Noted   Leg pain 05/22/2016   Severe persistent asthma 07/25/2015   Atopic dermatitis 07/25/2015   Allergic rhinitis 07/25/2015   Allergic rhinoconjunctivitis 07/25/2015   Idiopathic urticaria 07/25/2015   GERD (gastroesophageal reflux disease) 07/25/2015   Allergy with anaphylaxis due to food 07/25/2015   Arthralgia, sacroiliac 11/20/2013   Chronic pain due to injury 09/19/2013   Nerve root pain 09/19/2013   LBP (low back pain) 08/16/2013   Chronic pain associated with significant psychosocial dysfunction 08/16/2013   Left ovarian cyst 01/02/2013   Hypokalemia 01/02/2013   Abdominal pain 12/31/2012   Renal insufficiency 12/31/2012   Leukocytosis, unspecified 12/31/2012   Nausea and vomiting 12/31/2012    Past Surgical History:  Procedure Laterality Date   LEG SURGERY     tib fib left?    OB History   No obstetric history on file.      Home Medications    Prior to Admission medications   Medication Sig Start Date End Date Taking? Authorizing Provider  benzonatate (TESSALON) 100 MG capsule Take 1 capsule (100 mg total) by mouth every 8 (eight)  hours as needed for cough. 09/23/22  Yes Schelly Chuba, Hildred Alamin E, FNP  ondansetron (ZOFRAN-ODT) 4 MG disintegrating tablet Take 1 tablet (4 mg total) by mouth every 8 (eight) hours as needed for nausea or vomiting. 09/23/22  Yes Keigan Tafoya, Hildred Alamin E, FNP  albuterol (PROVENTIL) (2.5 MG/3ML) 0.083% nebulizer solution Take 2.5 mg by nebulization every 4 (four) hours as needed for wheezing or shortness of breath.    [provider]  cetirizine (ZYRTEC ALLERGY) 10 MG tablet Take 1 tablet (10 mg total) by mouth at bedtime. 09/13/22   Kozlow, Donnamarie Poag, MD  EPINEPHrine (EPIPEN 2-PAK) 0.3 mg/0.3 mL IJ SOAJ injection Inject 0.3 mg into the muscle as needed. 09/13/22   Kozlow, Donnamarie Poag, MD  fexofenadine (ALLEGRA) 180 MG tablet Take 1 tablet (180 mg total) by mouth in the morning. 08/09/22 09/08/22  Lynden [place] Scales, PA-C  Fluocinolone Acetonide (DERMOTIC) 0.01 % OIL Place 5 drops in ear(s) 2 (two) times daily as needed (Itching in ears). 08/09/22   Lynden [place] Scales, PA-C  fluticasone (FLONASE) 50 MCG/ACT nasal spray Place 1 spray into both nostrils daily. Begin by using 2 sprays in each nare daily for 3 to 5 days, then decrease to 1 spray in each nare daily. 08/09/22   Lynden [place] Scales, PA-C  hydrOXYzine (ATARAX) 25 MG tablet Take 0.5-1 tablets (12.5-25 mg total) by mouth every 8 (eight) hours as needed for itching. 09/07/22  Jaynee Eagles, PA-C  methylPREDNISolone (MEDROL DOSEPAK) 4 MG TBPK tablet Take 24 mg on day 1, 20 mg on day 2, 16 mg on day 3, 12 mg on day 4, 8 mg on day 5, 4 mg on day 6.  Take all tablets in each row at once, do not spread tablets out throughout the day. 08/09/22   Lynden [place] Scales, PA-C  montelukast (SINGULAIR) 10 MG tablet Take 1 tablet (10 mg total) by mouth at bedtime. 08/09/22 09/08/22  Lynden [place] Scales, PA-C  Olopatadine HCl (PATADAY) 0.2 % SOLN Apply 1 drop to eye daily. 08/09/22   Lynden [place] Scales, PA-C  predniSONE (DELTASONE) 20 MG tablet Day 1-5: Take 2 tablets  daily. Day 6-10: Take 1 tablet daily. Take tablets with breakfast. 09/07/22   Jaynee Eagles, PA-C  VENTOLIN HFA 108 (90 Base) MCG/ACT inhaler Inhale 2 puffs into the lungs every 4 (four) hours as needed for wheezing or shortness of breath. 06/28/22   Kozlow, Donnamarie Poag, MD    Family History Family History  Problem Relation Age of Onset   Macular degeneration Mother     Social History Social History   Tobacco Use   Smoking status: Never   Smokeless tobacco: Never  Substance Use Topics   Alcohol use: No   Drug use: No     Allergies   Other, Aspirin, Gabapentin, and Hydromorphone   Review of Systems Review of Systems Per HPI  Physical Exam Triage Vital Signs ED Triage Vitals [09/23/22 1554]  Enc Vitals Group     BP 117/67     Pulse Rate (!) 102     Resp 18     Temp 98.7 F (37.1 C)     Temp src      SpO2 98 %     Weight      Height      Head Circumference      Peak Flow      Pain Score 8     Pain Loc      Pain Edu?      Excl. in [place]?    No data found.  Updated Vital Signs BP 117/67   Pulse (!) 102   Temp 98.7 F (37.1 C)   Resp 18   LMP 09/06/2022 (Exact Date)   SpO2 98%   Visual Acuity Right Eye Distance:   Left Eye Distance:   Bilateral Distance:    Right Eye Near:   Left Eye Near:    Bilateral Near:     Physical Exam Constitutional:      General: She is not in acute distress.    Appearance: Normal appearance. She is not toxic-appearing or diaphoretic.  HENT:     Head: Normocephalic and atraumatic.     Right Ear: Tympanic membrane and ear canal normal.     Left Ear: Tympanic membrane and ear canal normal.     Nose: No congestion.     Mouth/Throat:     Mouth: Mucous membranes are moist.     Pharynx: No posterior oropharyngeal erythema.  Eyes:     Extraocular Movements: Extraocular movements intact.     Conjunctiva/sclera: Conjunctivae normal.     Pupils: Pupils are equal, round, and reactive to light.  Cardiovascular:     Rate and Rhythm:  Normal rate and regular rhythm.     Pulses: Normal pulses.     Heart sounds: Normal heart sounds.  Pulmonary:     Effort: Pulmonary effort is normal. No respiratory distress.  Breath sounds: Normal breath sounds. No stridor. No wheezing, rhonchi or rales.  Abdominal:     General: Abdomen is flat. Bowel sounds are normal. There is no distension.     Palpations: Abdomen is soft.     Tenderness: There is no abdominal tenderness.  Musculoskeletal:        General: Normal range of motion.     Cervical back: Normal range of motion.  Skin:    General: Skin is warm and dry.  Neurological:     General: No focal deficit present.     Mental Status: She is alert and oriented to person, place, and time. Mental status is at baseline.  Psychiatric:        Mood and Affect: Mood normal.        Behavior: Behavior normal.      UC Treatments / Results  Labs (all labs ordered are listed, but only abnormal results are displayed) Labs Reviewed  RESP PANEL BY RT-PCR (FLU A&B, COVID) [id number]    EKG   Radiology No results found.  Procedures Procedures (including critical care time)  Medications Ordered in UC Medications - No data to display  Initial Impression / Assessment and Plan / UC Course  I have reviewed the triage vital signs and the nursing notes.  Pertinent labs & imaging results that were available during my care of the patient were reviewed by me and considered in my medical decision making (see chart for details).     Symptoms appear viral in etiology.  COVID and flu test pending.  No signs of acute abdomen on exam or need for emergent evaluation.  Ondansetron and Tessalon Perles prescribed to take as needed for her symptoms.  Discussed supportive care symptom management with patient.  Patient denies that she takes any daily medications so these prescriptions should be safe.  Discussed return precautions.  Patient verbalized her standing and was agreeable with plan. Final  Clinical Impressions(s) / UC Diagnoses   Final diagnoses:  Viral illness  Nausea without vomiting  Acute cough     Discharge Instructions      You have a viral illness which should run its course and self resolve with symptomatic treatment.  I have prescribed you 2 medications including a nausea medication to take as needed.  Please follow-up if symptoms persist or worsen.    ED Prescriptions     Medication Sig Dispense Auth. Provider   ondansetron (ZOFRAN-ODT) 4 MG disintegrating tablet Take 1 tablet (4 mg total) by mouth every 8 (eight) hours as needed for nausea or vomiting. 20 tablet [place], [place] E, [place]   benzonatate (TESSALON) 100 MG capsule Take 1 capsule (100 mg total) by mouth every 8 (eight) hours as needed for cough. 21 capsule [place], Michele Rockers, Emily      PDMP not reviewed this encounter.   Teodora Medici, [place] 09/23/22 [place], [place],  09/23/22 [id number]

## 2022-09-23 NOTE — Discharge Instructions (Signed)
You have a viral illness which should run its course and self resolve with symptomatic treatment.  I have prescribed you 2 medications including a nausea medication to take as needed.  Please follow-up if symptoms persist or worsen.

## 2022-09-29 ENCOUNTER — Ambulatory Visit: Payer: Medicare Other

## 2022-10-19 ENCOUNTER — Ambulatory Visit
Admission: EM | Admit: 2022-10-19 | Discharge: 2022-10-19 | Disposition: A | Discharge status: Home / Self Care | Payer: Medicare Other | Attending: Urgent Care | Admitting: Urgent Care

## 2022-10-19 DIAGNOSIS — N760 Acute vaginitis: Secondary | ICD-10-CM | POA: Diagnosis not present

## 2022-10-19 MED ORDER — FLUCONAZOLE 150 MG PO TABS: 150.0000 mg | ORAL_TABLET | ORAL | 0 refills | Status: DC

## 2022-10-19 NOTE — ED Triage Notes (Signed)
Pt present to the office for vaginal discharge that started a couple days ago. Pt thinks she has a yeast infection.

## 2022-10-19 NOTE — ED Provider Notes (Signed)
[facility]  Note:  This document was prepared using Conservation officer, historic buildings and may include unintentional dictation errors.  MRN: [id number] DOB: Mar 19, 1976  Subjective:   Danielle Bass is a 46 y.o. female presenting for 2-day history of persistent vaginal discharge.  Patient is concerned about a yeast infection.  States that she has had some irregular bleeding vaginally for the past few months.  Thinks that she may be going into menopause.  She does have a gynecologist that she has been working with very closely.  The only new thing that she can think of is that she has been wearing an absorbent pad because of the irregular bleeding almost daily for the past month.  Otherwise no urine symptoms.  No concern for STI but is not opposed to check for this.  No history of DVT.  No current facility-administered medications for this encounter.  Current Outpatient Medications:    albuterol (PROVENTIL) (2.5 MG/3ML) 0.083% nebulizer solution, Take 2.5 mg by nebulization every 4 (four) hours as needed for wheezing or shortness of breath., Disp: , Rfl:    benzonatate (TESSALON) 100 MG capsule, Take 1 capsule (100 mg total) by mouth every 8 (eight) hours as needed for cough., Disp: 21 capsule, Rfl: 0   cetirizine (ZYRTEC ALLERGY) 10 MG tablet, Take 1 tablet (10 mg total) by mouth at bedtime., Disp: 90 tablet, Rfl: 1   EPINEPHrine (EPIPEN 2-PAK) 0.3 mg/0.3 mL IJ SOAJ injection, Inject 0.3 mg into the muscle as needed., Disp: 2 each, Rfl: 2   fexofenadine (ALLEGRA) 180 MG tablet, Take 1 tablet (180 mg total) by mouth in the morning., Disp: 30 tablet, Rfl: 0   Fluocinolone Acetonide (DERMOTIC) 0.01 % OIL, Place 5 drops in ear(s) 2 (two) times daily as needed (Itching in ears)., Disp: 20 mL, Rfl: 0   fluticasone (FLONASE) 50 MCG/ACT nasal spray, Place 1 spray into both nostrils daily. Begin by using 2 sprays in each nare daily for 3 to 5 days, then decrease to 1 spray in each  nare daily., Disp: 15.8 mL, Rfl: 2   hydrOXYzine (ATARAX) 25 MG tablet, Take 0.5-1 tablets (12.5-25 mg total) by mouth every 8 (eight) hours as needed for itching., Disp: 30 tablet, Rfl: 0   methylPREDNISolone (MEDROL DOSEPAK) 4 MG TBPK tablet, Take 24 mg on day 1, 20 mg on day 2, 16 mg on day 3, 12 mg on day 4, 8 mg on day 5, 4 mg on day 6.  Take all tablets in each row at once, do not spread tablets out throughout the day., Disp: 21 tablet, Rfl: 0   montelukast (SINGULAIR) 10 MG tablet, Take 1 tablet (10 mg total) by mouth at bedtime., Disp: 30 tablet, Rfl: 0   Olopatadine HCl (PATADAY) 0.2 % SOLN, Apply 1 drop to eye daily., Disp: 2.5 mL, Rfl: 1   ondansetron (ZOFRAN-ODT) 4 MG disintegrating tablet, Take 1 tablet (4 mg total) by mouth every 8 (eight) hours as needed for nausea or vomiting., Disp: 20 tablet, Rfl: 0   predniSONE (DELTASONE) 20 MG tablet, Day 1-5: Take 2 tablets daily. Day 6-10: Take 1 tablet daily. Take tablets with breakfast., Disp: 15 tablet, Rfl: 0   VENTOLIN HFA 108 (90 Base) MCG/ACT inhaler, Inhale 2 puffs into the lungs every 4 (four) hours as needed for wheezing or shortness of breath., Disp: 18 g, Rfl: 1   Allergies  Allergen Reactions   Other Anaphylaxis    Tree nut, Tomato sauce  Aspirin Other (See Comments)    Other reaction(s): Unknown "doctor doesn't want her to take" "doctor doesn't want her to take"   Gabapentin Itching   Hydromorphone Other (See Comments)    Drop in bp    Past Medical History:  Diagnosis Date   Acid reflux    Asthma    Bronchitis    Chronic back pain    Depression    Eczema      Past Surgical History:  Procedure Laterality Date   LEG SURGERY     tib fib left?    Family History  Problem Relation Age of Onset   Macular degeneration Mother     Social History   Tobacco Use   Smoking status: Never   Smokeless tobacco: Never  Substance Use Topics   Alcohol use: No   Drug use: No    ROS   Objective:   Vitals: BP  (!) 158/103 (BP Location: Left Arm)   Pulse 91   Temp 97.7 F (36.5 C) (Oral)   Resp 18   SpO2 98%   Physical Exam Constitutional:      General: She is not in acute distress.    Appearance: Normal appearance. She is well-developed. She is not ill-appearing, toxic-appearing or diaphoretic.  HENT:     Head: Normocephalic and atraumatic.     Nose: Nose normal.     Mouth/Throat:     Mouth: Mucous membranes are moist.  Eyes:     General: No scleral icterus.       Right eye: No discharge.        Left eye: No discharge.     Extraocular Movements: Extraocular movements intact.  Cardiovascular:     Rate and Rhythm: Normal rate.  Pulmonary:     Effort: Pulmonary effort is normal.  Skin:    General: Skin is warm and dry.  Neurological:     General: No focal deficit present.     Mental Status: She is alert and oriented to person, place, and time.  Psychiatric:        Mood and Affect: Mood normal.        Behavior: Behavior normal.     Assessment and Plan :   PDMP not reviewed this encounter.  1. Acute vaginitis     Patient has not had vaginal bleeding for the past 4 to 5 days.  Recommended avoiding using an absorbent pad for now.  Use fluconazole for empiric treatment of yeast vaginitis.  Labs pending, will treat as appropriate otherwise.  Follow-up with her gynecologist as well. Counseled patient on potential for adverse effects with medications prescribed/recommended today, ER and return-to-clinic precautions discussed, patient verbalized understanding.    Wallis Bamberg, PA-C 10/19/22 1950

## 2022-10-21 ENCOUNTER — Ambulatory Visit
Admission: EM | Admit: 2022-10-21 | Discharge: 2022-10-21 | Disposition: A | Discharge status: Home / Self Care | Payer: Medicare Other | Attending: Emergency Medicine | Admitting: Emergency Medicine

## 2022-10-21 ENCOUNTER — Telehealth: Payer: Self-pay

## 2022-10-21 DIAGNOSIS — N898 Other specified noninflammatory disorders of vagina: Secondary | ICD-10-CM

## 2022-10-24 LAB — CERVICOVAGINAL ANCILLARY ONLY
Bacterial Vaginitis (gardnerella): POSITIVE — AB
Candida Glabrata: NEGATIVE
Candida Vaginitis: NEGATIVE
Chlamydia: NEGATIVE
Comment: NEGATIVE
Comment: NEGATIVE
Comment: NEGATIVE
Comment: NEGATIVE
Comment: NEGATIVE
Comment: NORMAL
Neisseria Gonorrhea: NEGATIVE
Trichomonas: POSITIVE — AB

## 2022-10-25 ENCOUNTER — Telehealth (HOSPITAL_COMMUNITY): Payer: Self-pay | Admitting: Emergency Medicine

## 2022-10-25 ENCOUNTER — Telehealth: Payer: Self-pay

## 2022-10-25 MED ORDER — METRONIDAZOLE 500 MG PO TABS: 500.0000 mg | ORAL_TABLET | Freq: Two times a day (BID) | ORAL | 0 refills | Status: DC

## 2022-10-25 NOTE — Telephone Encounter (Signed)
Patient called in - DOB verified - gave [name] Voncannon, our Biologic Coordinator, contact information - [phone] - [phone],  to call regarding her Dupixent delivery.  Patient was also advised she can send [name] a message through [place] [facility] as well.  Patient verbalized understanding, no further questions.

## 2022-11-07 ENCOUNTER — Ambulatory Visit
Admission: EM | Admit: 2022-11-07 | Discharge: 2022-11-07 | Disposition: A | Discharge status: Home / Self Care | Payer: Medicare Other | Attending: Nurse Practitioner | Admitting: Nurse Practitioner

## 2022-11-07 DIAGNOSIS — R220 Localized swelling, mass and lump, head: Secondary | ICD-10-CM | POA: Diagnosis not present

## 2022-11-07 DIAGNOSIS — T7840XA Allergy, unspecified, initial encounter: Secondary | ICD-10-CM

## 2022-11-07 MED ORDER — PREDNISONE 20 MG PO TABS: 40.0000 mg | ORAL_TABLET | Freq: Every day | ORAL | 0 refills | Status: AC

## 2022-11-07 MED ORDER — DEXAMETHASONE SODIUM PHOSPHATE 10 MG/ML IJ SOLN
8.0000 mg | Freq: Once | INTRAMUSCULAR | Status: AC
  Administered 2022-11-07: 8 mg via INTRAMUSCULAR

## 2022-11-07 NOTE — Discharge Instructions (Addendum)
Start prednisone tomorrow May continue hydroxyzine as needed Follow-up with PCP 2 to 3 days for recheck Please go to the emergency room for any worsening symptoms

## 2022-11-07 NOTE — ED Triage Notes (Signed)
Pt presents with recurrent allergic reaction to unknown source; pt states she started having facial swelling & itching yesterday that is unrelieved with prescribed medication.

## 2022-11-07 NOTE — ED Provider Notes (Signed)
[name]    CSN: [id number] Arrival date & time: 11/07/22  1030      History   Chief Complaint Chief Complaint  Patient presents with   Allergic Reaction    HPI Danielle Bass is a 46 y.o. female presents for evaluation of a allergic reaction.  Patient states yesterday she began to have some facial swelling around her eyes.  This morning when she woke she began to have redness around her face with continued swelling and stated her face felt "like it was on fire".  She denies any lip, tongue, throat swelling.  No shortness of breath or difficulty swallowing.  Denies any new contacts, foods, or medications.  She has an extensive history of allergic reactions and is currently undergoing workup with her PCP.  She has been on oral steroids several times in the past, last tim was 2 months ago.  She did take hydroxyzine last night but did not take anything this morning.Denies rash on body. States this is how her reactions begin and then eventually spread to her neck and chest.    Allergic Reaction   Past Medical History:  Diagnosis Date   Acid reflux    Asthma    Bronchitis    Chronic back pain    Depression    Eczema     Patient Active Problem List   Diagnosis Date Noted   Leg pain 05/22/2016   Severe persistent asthma 07/25/2015   Atopic dermatitis 07/25/2015   Allergic rhinitis 07/25/2015   Allergic rhinoconjunctivitis 07/25/2015   Idiopathic urticaria 07/25/2015   GERD (gastroesophageal reflux disease) 07/25/2015   Allergy with anaphylaxis due to food 07/25/2015   Arthralgia, sacroiliac 11/20/2013   Chronic pain due to injury 09/19/2013   Nerve root pain 09/19/2013   LBP (low back pain) 08/16/2013   Chronic pain associated with significant psychosocial dysfunction 08/16/2013   Left ovarian cyst 01/02/2013   Hypokalemia 01/02/2013   Abdominal pain 12/31/2012   Renal insufficiency 12/31/2012   Leukocytosis, unspecified 12/31/2012   Nausea and  vomiting 12/31/2012    Past Surgical History:  Procedure Laterality Date   LEG SURGERY     tib fib left?    OB History   No obstetric history on file.      Home Medications    Prior to Admission medications   Medication Sig Start Date End Date Taking? Authorizing Provider  predniSONE (DELTASONE) 20 MG tablet Take 2 tablets (40 mg total) by mouth daily with breakfast for 5 days. 11/08/22 11/13/22 Yes Radford Pax, NP  albuterol (PROVENTIL) (2.5 MG/3ML) 0.083% nebulizer solution Take 2.5 mg by nebulization every 4 (four) hours as needed for wheezing or shortness of breath.    [provider]  benzonatate (TESSALON) 100 MG capsule Take 1 capsule (100 mg total) by mouth every 8 (eight) hours as needed for cough. 09/23/22   Gustavus Bryant, FNP  cetirizine (ZYRTEC ALLERGY) 10 MG tablet Take 1 tablet (10 mg total) by mouth at bedtime. 09/13/22   Kozlow, Alvira Philips, MD  EPINEPHrine (EPIPEN 2-PAK) 0.3 mg/0.3 mL IJ SOAJ injection Inject 0.3 mg into the muscle as needed. 09/13/22   Kozlow, Alvira Philips, MD  fexofenadine (ALLEGRA) 180 MG tablet Take 1 tablet (180 mg total) by mouth in the morning. 08/09/22 09/08/22  Theadora Rama Scales, PA-C  fluconazole (DIFLUCAN) 150 MG tablet Take 1 tablet (150 mg total) by mouth once a week. 10/19/22   Wallis Bamberg, PA-C  Fluocinolone Acetonide (DERMOTIC) 0.01 %  OIL Place 5 drops in ear(s) 2 (two) times daily as needed (Itching in ears). 08/09/22   Theadora Rama Scales, PA-C  fluticasone (FLONASE) 50 MCG/ACT nasal spray Place 1 spray into both nostrils daily. Begin by using 2 sprays in each nare daily for 3 to 5 days, then decrease to 1 spray in each nare daily. 08/09/22   Theadora Rama Scales, PA-C  hydrOXYzine (ATARAX) 25 MG tablet Take 0.5-1 tablets (12.5-25 mg total) by mouth every 8 (eight) hours as needed for itching. 09/07/22   Wallis Bamberg, PA-C  metroNIDAZOLE (FLAGYL) 500 MG tablet Take 1 tablet (500 mg total) by mouth 2 (two) times daily. 10/25/22    Lamptey, Britta Mccreedy, MD  montelukast (SINGULAIR) 10 MG tablet Take 1 tablet (10 mg total) by mouth at bedtime. 08/09/22 09/08/22  Theadora Rama Scales, PA-C  Olopatadine HCl (PATADAY) 0.2 % SOLN Apply 1 drop to eye daily. 08/09/22   Theadora Rama Scales, PA-C  ondansetron (ZOFRAN-ODT) 4 MG disintegrating tablet Take 1 tablet (4 mg total) by mouth every 8 (eight) hours as needed for nausea or vomiting. 09/23/22   Mound, Acie Fredrickson, FNP  VENTOLIN HFA 108 (90 Base) MCG/ACT inhaler Inhale 2 puffs into the lungs every 4 (four) hours as needed for wheezing or shortness of breath. 06/28/22   Kozlow, Alvira Philips, MD    Family History Family History  Problem Relation Age of Onset   Macular degeneration Mother     Social History Social History   Tobacco Use   Smoking status: Never   Smokeless tobacco: Never  Substance Use Topics   Alcohol use: No   Drug use: No     Allergies   Other, Aspirin, Gabapentin, and Hydromorphone   Review of Systems Review of Systems  Skin:        Facial swelling     Physical Exam Triage Vital Signs ED Triage Vitals  Enc Vitals Group     BP 11/07/22 1158 130/88     Pulse Rate 11/07/22 1158 82     Resp 11/07/22 1158 18     Temp 11/07/22 1158 98 F (36.7 C)     Temp Source 11/07/22 1158 Oral     SpO2 11/07/22 1158 99 %     Weight --      Height --      Head Circumference --      Peak Flow --      Pain Score 11/07/22 1159 0     Pain Loc --      Pain Edu? --      Excl. in GC? --    No data found.  Updated Vital Signs BP 130/88 (BP Location: Left Arm)   Pulse 82   Temp 98 F (36.7 C) (Oral)   Resp 18   LMP 11/03/2022   SpO2 99%   Visual Acuity Right Eye Distance:   Left Eye Distance:   Bilateral Distance:    Right Eye Near:   Left Eye Near:    Bilateral Near:     Physical Exam Vitals and nursing note reviewed.  Constitutional:      Appearance: Normal appearance.  HENT:     Head: Normocephalic and atraumatic. No right periorbital  erythema or left periorbital erythema.     Comments: Very mild swelling around bilateral eyes without erythema. No obvious facial swelling. No rashes or lip swelling.     Mouth/Throat:     Lips: Pink.     Mouth: Mucous membranes are moist.  No angioedema.     Tongue: Tongue does not deviate from midline.     Pharynx: Oropharynx is clear. Uvula midline. No pharyngeal swelling or uvula swelling.  Eyes:     General: Lids are normal.     Extraocular Movements: Extraocular movements intact.     Conjunctiva/sclera: Conjunctivae normal.     Pupils: Pupils are equal, round, and reactive to light.  Cardiovascular:     Rate and Rhythm: Normal rate and regular rhythm.     Heart sounds: Normal heart sounds.  Pulmonary:     Effort: Pulmonary effort is normal. No respiratory distress.     Breath sounds: Normal breath sounds. No wheezing.  Skin:    General: Skin is warm and dry.  Neurological:     General: No focal deficit present.     Mental Status: She is alert and oriented to person, place, and time.  Psychiatric:        Mood and Affect: Mood normal.        Behavior: Behavior normal.      UC Treatments / Results  Labs (all labs ordered are listed, but only abnormal results are displayed) Labs Reviewed - No data to display  EKG   Radiology No results found.  Procedures Procedures (including critical care time)  Medications Ordered in UC Medications  dexamethasone (DECADRON) injection 8 mg (8 mg Intramuscular Given 11/07/22 1221)    Initial Impression / Assessment and Plan / UC Course  I have reviewed the triage vital signs and the nursing notes.  Pertinent labs & imaging results that were available during my care of the patient were reviewed by me and considered in my medical decision making (see chart for details).     Reviewed exam and sx with patient. No red flags on exam.  Airway is patent and vital signs stable Given hx, pt given decadron in clinic IM Start prednisone  tomorrow May continue hydroxyzine as needed Follow-up with PCP 2 to 3 days for recheck ER precautions reviewed and patient verbalized understanding Final Clinical Impressions(s) / UC Diagnoses   Final diagnoses:  Allergic reaction, initial encounter  Facial swelling     Discharge Instructions      Start prednisone tomorrow May continue hydroxyzine as needed Follow-up with PCP 2 to 3 days for recheck Please go to the emergency room for any worsening symptoms     ED Prescriptions     Medication Sig Dispense Auth. Provider   predniSONE (DELTASONE) 20 MG tablet Take 2 tablets (40 mg total) by mouth daily with breakfast for 5 days. 10 tablet Radford Pax, NP      PDMP not reviewed this encounter.   Radford Pax, NP 11/07/22 1225

## 2022-11-26 ENCOUNTER — Encounter: Payer: Self-pay | Admitting: Emergency Medicine

## 2022-11-26 ENCOUNTER — Ambulatory Visit
Admission: EM | Admit: 2022-11-26 | Discharge: 2022-11-26 | Disposition: A | Discharge status: Home / Self Care | Payer: Medicare Other | Attending: Nurse Practitioner | Admitting: Nurse Practitioner

## 2022-11-26 ENCOUNTER — Other Ambulatory Visit: Payer: Self-pay

## 2022-11-26 DIAGNOSIS — R220 Localized swelling, mass and lump, head: Secondary | ICD-10-CM | POA: Diagnosis not present

## 2022-11-26 DIAGNOSIS — T7840XA Allergy, unspecified, initial encounter: Secondary | ICD-10-CM | POA: Diagnosis not present

## 2022-11-26 MED ORDER — METHYLPREDNISOLONE SODIUM SUCC 125 MG IJ SOLR
60.0000 mg | Freq: Once | INTRAMUSCULAR | Status: AC
  Administered 2022-11-26: 60 mg via INTRAMUSCULAR

## 2022-11-26 MED ORDER — PREDNISONE 10 MG (21) PO TBPK: ORAL_TABLET | Freq: Every day | ORAL | 0 refills | Status: DC

## 2022-11-26 NOTE — ED Triage Notes (Signed)
Pt here for allergic reaction with facial swelling and hives x 2 days; hx of same in past

## 2022-11-26 NOTE — Discharge Instructions (Addendum)
Start prednisone taper tomorrow You may continue Benadryl or hydroxyzine today as needed.  Do not take these both together as they are the same type of medication Follow-up with your allergist as scheduled appointment 1/9 Please go to the emergency room/call 911 for any worsening symptoms

## 2022-11-26 NOTE — ED Provider Notes (Signed)
[name]    CSN: [id number] Arrival date & time: 11/26/22  0931      History   Chief Complaint Chief Complaint  Patient presents with   Allergic Reaction    HPI Danielle Bass is a 47 y.o. female presents for evaluation of allergic reaction.  Patient reports the past couple days she has had intermittent facial swelling with hives.  States this is an ongoing issue and she is following with an allergist for this.  Denies any throat, lip, tongue swelling or difficulty breathing/swallowing.  Denies any new exposures or medications.  She did take Benadryl and hydroxyzine last night.  She has not taken anything today.  She has a follow-up with her allergist on 1/9.  No other concerns at this time.   Allergic Reaction Presenting symptoms: rash     Past Medical History:  Diagnosis Date   Acid reflux    Asthma    Bronchitis    Chronic back pain    Depression    Eczema     Patient Active Problem List   Diagnosis Date Noted   Leg pain 05/22/2016   Severe persistent asthma 07/25/2015   Atopic dermatitis 07/25/2015   Allergic rhinitis 07/25/2015   Allergic rhinoconjunctivitis 07/25/2015   Idiopathic urticaria 07/25/2015   GERD (gastroesophageal reflux disease) 07/25/2015   Allergy with anaphylaxis due to food 07/25/2015   Arthralgia, sacroiliac 11/20/2013   Chronic pain due to injury 09/19/2013   Nerve root pain 09/19/2013   LBP (low back pain) 08/16/2013   Chronic pain associated with significant psychosocial dysfunction 08/16/2013   Left ovarian cyst 01/02/2013   Hypokalemia 01/02/2013   Abdominal pain 12/31/2012   Renal insufficiency 12/31/2012   Leukocytosis, unspecified 12/31/2012   Nausea and vomiting 12/31/2012    Past Surgical History:  Procedure Laterality Date   LEG SURGERY     tib fib left?    OB History   No obstetric history on file.      Home Medications    Prior to Admission medications   Medication Sig Start Date End Date  Taking? Authorizing Provider  predniSONE (STERAPRED UNI-PAK 21 TAB) 10 MG (21) TBPK tablet Take by mouth daily. Take 6 tabs by mouth daily  for 1 days, then 5 tabs for 1 days, then 4 tabs for 1 days, then 3 tabs for 1 days, 2 tabs for 1 days, then 1 tab by mouth daily for 1 day 11/27/22  Yes Melynda Ripple, NP  albuterol (PROVENTIL) (2.5 MG/3ML) 0.083% nebulizer solution Take 2.5 mg by nebulization every 4 (four) hours as needed for wheezing or shortness of breath.    [provider]  benzonatate (TESSALON) 100 MG capsule Take 1 capsule (100 mg total) by mouth every 8 (eight) hours as needed for cough. 09/23/22   Teodora Medici, FNP  cetirizine (ZYRTEC ALLERGY) 10 MG tablet Take 1 tablet (10 mg total) by mouth at bedtime. 09/13/22   Kozlow, Donnamarie Poag, MD  EPINEPHrine (EPIPEN 2-PAK) 0.3 mg/0.3 mL IJ SOAJ injection Inject 0.3 mg into the muscle as needed. 09/13/22   Kozlow, Donnamarie Poag, MD  fexofenadine (ALLEGRA) 180 MG tablet Take 1 tablet (180 mg total) by mouth in the morning. 08/09/22 09/08/22  Lynden Oxford Scales, PA-C  fluconazole (DIFLUCAN) 150 MG tablet Take 1 tablet (150 mg total) by mouth once a week. 10/19/22   Jaynee Eagles, PA-C  Fluocinolone Acetonide (DERMOTIC) 0.01 % OIL Place 5 drops in ear(s) 2 (two) times daily as  needed (Itching in ears). 08/09/22   Theadora Rama Scales, PA-C  fluticasone (FLONASE) 50 MCG/ACT nasal spray Place 1 spray into both nostrils daily. Begin by using 2 sprays in each nare daily for 3 to 5 days, then decrease to 1 spray in each nare daily. 08/09/22   Theadora Rama Scales, PA-C  hydrOXYzine (ATARAX) 25 MG tablet Take 0.5-1 tablets (12.5-25 mg total) by mouth every 8 (eight) hours as needed for itching. 09/07/22   Wallis Bamberg, PA-C  metroNIDAZOLE (FLAGYL) 500 MG tablet Take 1 tablet (500 mg total) by mouth 2 (two) times daily. 10/25/22   Lamptey, Britta Mccreedy, MD  montelukast (SINGULAIR) 10 MG tablet Take 1 tablet (10 mg total) by mouth at bedtime. 08/09/22 09/08/22   Theadora Rama Scales, PA-C  Olopatadine HCl (PATADAY) 0.2 % SOLN Apply 1 drop to eye daily. 08/09/22   Theadora Rama Scales, PA-C  ondansetron (ZOFRAN-ODT) 4 MG disintegrating tablet Take 1 tablet (4 mg total) by mouth every 8 (eight) hours as needed for nausea or vomiting. 09/23/22   Mound, Acie Fredrickson, FNP  VENTOLIN HFA 108 (90 Base) MCG/ACT inhaler Inhale 2 puffs into the lungs every 4 (four) hours as needed for wheezing or shortness of breath. 06/28/22   Kozlow, Alvira Philips, MD    Family History Family History  Problem Relation Age of Onset   Macular degeneration Mother     Social History Social History   Tobacco Use   Smoking status: Never   Smokeless tobacco: Never  Substance Use Topics   Alcohol use: No   Drug use: No     Allergies   Other, Aspirin, Gabapentin, and Hydromorphone   Review of Systems Review of Systems  HENT:         Facial swelling  Skin:  Positive for rash.     Physical Exam Triage Vital Signs ED Triage Vitals  Enc Vitals Group     BP 11/26/22 1023 119/71     Pulse Rate 11/26/22 1023 96     Resp 11/26/22 1023 18     Temp 11/26/22 1023 98.2 F (36.8 C)     Temp Source 11/26/22 1023 Oral     SpO2 11/26/22 1023 99 %     Weight --      Height --      Head Circumference --      Peak Flow --      Pain Score 11/26/22 1024 0     Pain Loc --      Pain Edu? --      Excl. in GC? --    No data found.  Updated Vital Signs BP 119/71 (BP Location: Right Arm)   Pulse 96   Temp 98.2 F (36.8 C) (Oral)   Resp 18   LMP 11/03/2022   SpO2 99%   Visual Acuity Right Eye Distance:   Left Eye Distance:   Bilateral Distance:    Right Eye Near:   Left Eye Near:    Bilateral Near:     Physical Exam Vitals and nursing note reviewed.  Constitutional:      Appearance: Normal appearance.  HENT:     Head: Normocephalic and atraumatic.     Nose: Nose normal.     Mouth/Throat:     Tongue: Tongue does not deviate from midline.     Pharynx: Oropharynx is  clear. Uvula midline. No pharyngeal swelling or uvula swelling.  Eyes:     Pupils: Pupils are equal, round, and reactive to light.  Cardiovascular:     Rate and Rhythm: Normal rate and regular rhythm.     Heart sounds: Normal heart sounds.  Pulmonary:     Effort: Pulmonary effort is normal.     Breath sounds: Normal breath sounds.  Skin:    General: Skin is warm and dry.     Comments: No visible rashes or hives on exam  Neurological:     General: No focal deficit present.     Mental Status: She is alert and oriented to person, place, and time.  Psychiatric:        Mood and Affect: Mood normal.        Behavior: Behavior normal.      UC Treatments / Results  Labs (all labs ordered are listed, but only abnormal results are displayed) Labs Reviewed - No data to display  EKG   Radiology No results found.  Procedures Procedures (including critical care time)  Medications Ordered in UC Medications  methylPREDNISolone sodium succinate (SOLU-MEDROL) 125 mg/2 mL injection 60 mg (has no administration in time range)    Initial Impression / Assessment and Plan / UC Course  I have reviewed the triage vital signs and the nursing notes.  Pertinent labs & imaging results that were available during my care of the patient were reviewed by me and considered in my medical decision making (see chart for details).     Reviewed exam and symptoms with patient.  No red flags on exam. Patient given Solu-Medrol injection in clinic.  Monitored for 15 minutes with no reaction noted and tolerated well Start prednisone taper tomorrow May continue Benadryl or hydroxyzine as needed.  Advised not to take these together they are both antihistamines Follow-up with allergist as scheduled appointment on Tuesday ER precautions reviewed and patient verbalized understanding Final Clinical Impressions(s) / UC Diagnoses   Final diagnoses:  Allergic reaction, initial encounter  Facial swelling      Discharge Instructions      Start prednisone taper tomorrow You may continue Benadryl or hydroxyzine today as needed.  Do not take these both together as they are the same type of medication Follow-up with your allergist as scheduled appointment 1/9 Please go to the emergency room/call 911 for any worsening symptoms     ED Prescriptions     Medication Sig Dispense Auth. Provider   predniSONE (STERAPRED UNI-PAK 21 TAB) 10 MG (21) TBPK tablet Take by mouth daily. Take 6 tabs by mouth daily  for 1 days, then 5 tabs for 1 days, then 4 tabs for 1 days, then 3 tabs for 1 days, 2 tabs for 1 days, then 1 tab by mouth daily for 1 day 21 tablet Melynda Ripple, NP      PDMP not reviewed this encounter.   Melynda Ripple, NP 11/26/22 1115

## 2022-11-29 ENCOUNTER — Encounter: Payer: Self-pay | Admitting: Allergy and Immunology

## 2022-11-29 ENCOUNTER — Other Ambulatory Visit: Payer: Self-pay

## 2022-11-29 ENCOUNTER — Ambulatory Visit (INDEPENDENT_AMBULATORY_CARE_PROVIDER_SITE_OTHER): Payer: Medicare Other | Admitting: Allergy and Immunology

## 2022-11-29 VITALS — BP 116/68 | HR 83 | Temp 97.6°F | Resp 14 | Ht 65.75 in | Wt 167.9 lb

## 2022-11-29 DIAGNOSIS — J3089 Other allergic rhinitis: Secondary | ICD-10-CM

## 2022-11-29 DIAGNOSIS — L2089 Other atopic dermatitis: Secondary | ICD-10-CM | POA: Diagnosis not present

## 2022-11-29 DIAGNOSIS — L500 Allergic urticaria: Secondary | ICD-10-CM

## 2022-11-29 DIAGNOSIS — M818 Other osteoporosis without current pathological fracture: Secondary | ICD-10-CM

## 2022-11-29 DIAGNOSIS — J4530 Mild persistent asthma, uncomplicated: Secondary | ICD-10-CM

## 2022-11-29 DIAGNOSIS — T7840XD Allergy, unspecified, subsequent encounter: Secondary | ICD-10-CM

## 2022-11-29 MED ORDER — DUPILUMAB 300 MG/2ML ~~LOC~~ SOSY
300.0000 mg | PREFILLED_SYRINGE | Freq: Once | SUBCUTANEOUS | Status: AC
  Administered 2022-11-29: 300 mg via SUBCUTANEOUS

## 2022-11-29 MED ORDER — PIMECROLIMUS 1 % EX CREA: TOPICAL_CREAM | Freq: Two times a day (BID) | CUTANEOUS | 3 refills | Status: DC

## 2022-11-29 MED ORDER — MOMETASONE FUROATE 0.1 % EX OINT: TOPICAL_OINTMENT | Freq: Every day | CUTANEOUS | 5 refills | Status: DC

## 2022-11-29 MED ORDER — EPINEPHRINE 0.3 MG/0.3ML IJ SOAJ: 0.3000 mg | INTRAMUSCULAR | 2 refills | Status: DC | PRN

## 2022-11-29 MED ORDER — MONTELUKAST SODIUM 10 MG PO TABS: 10.0000 mg | ORAL_TABLET | Freq: Every evening | ORAL | 5 refills | Status: DC

## 2022-11-29 MED ORDER — FAMOTIDINE 40 MG PO TABS: 40.0000 mg | ORAL_TABLET | Freq: Every day | ORAL | 5 refills | Status: DC

## 2022-11-29 MED ORDER — HYDROXYZINE HCL 25 MG PO TABS: 25.0000 mg | ORAL_TABLET | Freq: Two times a day (BID) | ORAL | 5 refills | Status: DC

## 2022-11-29 MED ORDER — NEBULIZER MISC: 1.0000 | 1 refills | Status: DC

## 2022-11-29 NOTE — Progress Notes (Signed)
[place] [facility] [place] - [place] - [place] - [place]   Follow-up Note  Referring Provider: Audery Amel, MD Primary Provider: Audery Amel, MD Date of Office Visit: 11/29/2022  Subjective:   Danielle Bass (DOB: 04/16/1976) is a 47 y.o. female who [facility] in re-evaluation of the following:  HPI: Danielle Bass presents to this clinic in reevaluation of multiorgan atopic disease including severe atopic dermatitis, asthma, allergic rhinitis, and history of intermittent urticaria/allergic reactions.  I last saw her in this clinic 13 September 2022.  We gave her a loading dose of dupilumab during her last visit but she never had any additional doses because she is having problems working through the paperwork.  As well, she is having problems working through the paperwork of obtaining Medicaid.  Currently she is on Medicare.  She really cannot afford much therapy while only having coverage through Medicare.  She unfortunately has been having recurrent episodes of urticaria and some periorbital and perioral swelling and has had very bad flares of her eczematous dermatitis and overall has intense itchiness on it daily basis and is not receiving any benefit from the therapy that she uses at this point in time which includes hydroxyzine twice a day and topical therapy.  She has been to the urgent care center multiple times since I last seen her and she thinks that she has received systemic steroids at least 4 times since her last visit in the treatment of this issue.  She cannot identify any specific trigger that gives rise to her immunological hyperreactivity.  She has not been having any airway problems presently.  She rarely uses a short acting bronchodilator.  Allergies as of 11/29/2022       Reactions   Justicia Adhatoda (malabar Nut Tree) [justicia Adhatoda] Anaphylaxis   Tomato Anaphylaxis   Aspirin Other (See Comments)   Other  reaction(s): Unknown "doctor doesn't want her to take" "doctor doesn't want her to take"   Gabapentin Itching   Hydromorphone Other (See Comments)   Drop in bp        Medication List    Ventolin HFA 108 (90 Base) MCG/ACT inhaler Generic drug: albuterol Inhale 2 puffs into the lungs every 4 (four) hours as needed for wheezing or shortness of breath.   albuterol (2.5 MG/3ML) 0.083% nebulizer solution Commonly known as: PROVENTIL Take 2.5 mg by nebulization every 4 (four) hours as needed for wheezing or shortness of breath.   EPINEPHrine 0.3 mg/0.3 mL Soaj injection Commonly known as: EpiPen 2-Pak Inject 0.3 mg into the muscle as needed.   fluticasone 50 MCG/ACT nasal spray Commonly known as: FLONASE Place 1 spray into both nostrils daily. Begin by using 2 sprays in each nare daily for 3 to 5 days, then decrease to 1 spray in each nare daily.   hydrOXYzine 25 MG tablet Commonly known as: ATARAX Take 0.5-1 tablets (12.5-25 mg total) by mouth every 8 (eight) hours as needed for itching.   ondansetron 4 MG disintegrating tablet Commonly known as: ZOFRAN-ODT Take 1 tablet (4 mg total) by mouth every 8 (eight) hours as needed for nausea or vomiting.    Past Medical History:  Diagnosis Date   Acid reflux    Asthma    Bronchitis    Chronic back pain    Depression    Eczema     Past Surgical History:  Procedure Laterality Date   LEG SURGERY     tib fib left?  Review of systems negative except as noted in HPI / PMHx or noted below:  Review of Systems  Constitutional: Negative.   HENT: Negative.    Eyes: Negative.   Respiratory: Negative.    Cardiovascular: Negative.   Gastrointestinal: Negative.   Genitourinary: Negative.   Musculoskeletal: Negative.   Skin: Negative.   Neurological: Negative.   Endo/Heme/Allergies: Negative.   Psychiatric/Behavioral: Negative.       Objective:   Vitals:   11/29/22 0911  BP: 116/68  Pulse: 83  Resp: 14  Temp: 97.6  F (36.4 C)  SpO2: 100%   Height: 5' 5.75" (167 cm)  Weight: 167 lb 14.4 oz (76.2 kg)   Physical Exam Constitutional:      Appearance: She is not diaphoretic.  HENT:     Head: Normocephalic.     Right Ear: Tympanic membrane, ear canal and external ear normal.     Left Ear: Tympanic membrane, ear canal and external ear normal.     Nose: Nose normal. No mucosal edema or rhinorrhea.     Mouth/Throat:     Pharynx: Uvula midline. No oropharyngeal exudate.  Eyes:     Conjunctiva/sclera: Conjunctivae normal.  Neck:     Thyroid: No thyromegaly.     Trachea: Trachea normal. No tracheal tenderness or tracheal deviation.  Cardiovascular:     Rate and Rhythm: Normal rate and regular rhythm.     Heart sounds: Normal heart sounds, S1 normal and S2 normal. No murmur heard. Pulmonary:     Effort: No respiratory distress.     Breath sounds: Normal breath sounds. No stridor. No wheezing or rales.  Lymphadenopathy:     Head:     Right side of head: No tonsillar adenopathy.     Left side of head: No tonsillar adenopathy.     Cervical: No cervical adenopathy.  Skin:    Findings: No erythema or rash (Erythroderma, scale globally).     Nails: There is no clubbing.  Neurological:     Mental Status: She is alert.     Diagnostics:    Spirometry was performed and demonstrated an FEV1 of 2.30 at 92 % of predicted.  Results of blood tests obtained 04 July 2022 identifies WBC 8.4, absolute eosinophil 400, absolute lymphocyte 1700, hemoglobin 10.7, platelet 258, creatinine 0.73 mg/DL.  Assessment and Plan:   1. Other atopic dermatitis   2. Other allergic rhinitis   3. Asthma, well controlled, mild persistent   4. Allergic reaction, subsequent encounter   5. Allergic urticaria   6. Steroid-induced osteoporosis     1.  Restart dupilumab.  600 mg loading dose today and then 300 mg every 2 weeks.  2. Every day use the following combination:  A. Hydroxyzine 25 mg - 1 tablet 2 times per  day B. Famotidine 40 mg - 1 tablet 1 time per day C. Montelukast 10 mg - 1 tablet 1 time per day D. Bath/shower followed by Elidel + mometasone 0.1% ointment 1 time per day  3.  Danielle Bass will work through approval for dupilumab administration  4. Go to Social Service to apply for Medicaid (new rules 2024)  5.  If needed:  A. Albuterol HFA - 2 inhalations every 4-6 hours B. Epi-Pen, benadryl, MD/ER evaluation for allergic reaction  6. Blood - CBC w/d, CMP, alpha-gal panel, tryptase, Vit D  7. Obtain bone density scan to examine for osteoporosis.  Use vitamin D and calcium supplementation.  8. No more steroids / prednisone  9. Return  to clinic in 4 weeks or earlier if problem  Danielle Bass definitely without any doubt needs to consistently use her dupilumab and we need to get that worked out with her insurance company about this issue.  As well, she needs to apply for Medicaid so that she can receive help regarding affordable healthcare because right now she is delaying therapy and evaluation because of finances.  She will use a combination of an H1 and H2 receptor blocker and a leukotriene modifier to hopefully decrease her immunological hyperreactivity and hopefully we can keep her away from the administration of systemic steroids.  We are going to work through a few issues that might be giving rise to her immunological hyperactivity with the blood test noted above.  Will also screen her for osteoporosis given her very extensive systemic steroid use.  I will see her back in this clinic in 4 weeks.  Laurette Schimke, MD Allergy / Immunology [facility]

## 2022-11-29 NOTE — Patient Instructions (Addendum)
  1.  Restart dupilumab.  600 mg loading dose today and then 300 mg every 2 weeks.  2. Every day use the following combination:  A. Hydroxyzine 25 mg - 1 tablet 2 times per day B. Famotidine 40 mg - 1 tablet 1 time per day C. Montelukast 10 mg - 1 tablet 1 time per day D. Bath/shower followed by Elidel + mometasone 0.1% ointment 1 time per day  3.  [name] will work through approval for dupilumab administration  4. Go to Social Service to apply for Medicaid (new rules 2024)  5.  If needed:  A. Albuterol HFA - 2 inhalations every 4-6 hours B. Epi-Pen, benadryl, MD/ER evaluation for allergic reaction  6. Blood - CBC w/d, CMP, alpha-gal panel, tryptase, Vit D  7. Obtain bone density scan to examine for osteoporosis.  Use vitamin D and calcium supplementation  8. Return to clinic in 4 weeks or earlier if problem  9. No more steroids / prednisone

## 2022-11-30 ENCOUNTER — Encounter: Payer: Self-pay | Admitting: Allergy and Immunology

## 2022-11-30 ENCOUNTER — Telehealth: Payer: Self-pay | Admitting: *Deleted

## 2022-11-30 NOTE — Telephone Encounter (Signed)
Received fax from [place] asking for confirm of address and phone number to confirm for shipping medications and contacting the patient. Called and left a voicemail asking for return call to confirm. Faxed forms have been placed in the pending tray in the A side of the [place] office.

## 2022-11-30 NOTE — Telephone Encounter (Signed)
Patient called back and provided updated address and phone number which is what we have on file. Fax form has been filled out and faxed back to [facility]. Patient is aware.

## 2022-12-02 LAB — TRYPTASE: Tryptase: 10.2 ug/L (ref 2.2–13.2)

## 2022-12-02 LAB — CBC WITH DIFFERENTIAL
Basophils Absolute: 0 10*3/uL (ref 0.0–0.2)
Basos: 1 %
EOS (ABSOLUTE): 0.3 10*3/uL (ref 0.0–0.4)
Eos: 3 %
Hematocrit: 31.1 % — ABNORMAL LOW (ref 34.0–46.6)
Hemoglobin: 9.1 g/dL — ABNORMAL LOW (ref 11.1–15.9)
Immature Grans (Abs): 0 10*3/uL (ref 0.0–0.1)
Immature Granulocytes: 0 %
Lymphocytes Absolute: 1.6 10*3/uL (ref 0.7–3.1)
Lymphs: 19 %
MCH: 23.6 pg — ABNORMAL LOW (ref 26.6–33.0)
MCHC: 29.3 g/dL — ABNORMAL LOW (ref 31.5–35.7)
MCV: 81 fL (ref 79–97)
Monocytes Absolute: 0.6 10*3/uL (ref 0.1–0.9)
Monocytes: 7 %
Neutrophils Absolute: 6 10*3/uL (ref 1.4–7.0)
Neutrophils: 70 %
RBC: 3.85 x10E6/uL (ref 3.77–5.28)
RDW: 14.9 % (ref 11.7–15.4)
WBC: 8.5 10*3/uL (ref 3.4–10.8)

## 2022-12-02 LAB — COMPREHENSIVE METABOLIC PANEL
ALT: 9 IU/L (ref 0–32)
AST: 11 IU/L (ref 0–40)
Albumin/Globulin Ratio: 1.6 (ref 1.2–2.2)
Albumin: 4.1 g/dL (ref 3.9–4.9)
Alkaline Phosphatase: 47 IU/L (ref 44–121)
BUN/Creatinine Ratio: 14 (ref 9–23)
BUN: 10 mg/dL (ref 6–24)
Bilirubin Total: 0.6 mg/dL (ref 0.0–1.2)
CO2: 23 mmol/L (ref 20–29)
Calcium: 8.8 mg/dL (ref 8.7–10.2)
Chloride: 105 mmol/L (ref 96–106)
Creatinine, Ser: 0.74 mg/dL (ref 0.57–1.00)
Globulin, Total: 2.5 g/dL (ref 1.5–4.5)
Glucose: 69 mg/dL — ABNORMAL LOW (ref 70–99)
Potassium: 3.7 mmol/L (ref 3.5–5.2)
Sodium: 139 mmol/L (ref 134–144)
Total Protein: 6.6 g/dL (ref 6.0–8.5)
eGFR: 101 mL/min/{1.73_m2} (ref >59)

## 2022-12-02 LAB — ALPHA-GAL PANEL
Allergen Lamb IgE: <0.1 kU/L
Beef IgE: <0.1 kU/L
IgE (Immunoglobulin E), Serum: 162 IU/mL (ref 6–495)
O215-IgE Alpha-Gal: <0.1 kU/L
Pork IgE: <0.1 kU/L

## 2022-12-06 DIAGNOSIS — L2089 Other atopic dermatitis: Secondary | ICD-10-CM | POA: Diagnosis not present

## 2022-12-14 ENCOUNTER — Ambulatory Visit (HOSPITAL_BASED_OUTPATIENT_CLINIC_OR_DEPARTMENT_OTHER)
Admission: RE | Admit: 2022-12-14 | Discharge: 2022-12-14 | Disposition: A | Discharge status: Home / Self Care | Payer: Medicare Other | Source: Ambulatory Visit | Attending: Allergy and Immunology | Admitting: Allergy and Immunology

## 2022-12-14 ENCOUNTER — Other Ambulatory Visit: Payer: Self-pay

## 2022-12-14 ENCOUNTER — Other Ambulatory Visit (HOSPITAL_COMMUNITY): Payer: Self-pay

## 2022-12-14 ENCOUNTER — Telehealth: Payer: Self-pay | Admitting: *Deleted

## 2022-12-14 DIAGNOSIS — T380X5A Adverse effect of glucocorticoids and synthetic analogues, initial encounter: Secondary | ICD-10-CM | POA: Diagnosis present

## 2022-12-14 DIAGNOSIS — M818 Other osteoporosis without current pathological fracture: Secondary | ICD-10-CM | POA: Diagnosis not present

## 2022-12-14 MED ORDER — DUPIXENT 300 MG/2ML ~~LOC~~ SOSY
600.0000 mg | PREFILLED_SYRINGE | Freq: Once | SUBCUTANEOUS | 11 refills | Status: AC
  Filled 2022-12-14: qty 4, 14d supply, fill #0
  Filled 2023-01-04: qty 4, 28d supply, fill #1
  Filled 2023-02-02: qty 4, 28d supply, fill #2
  Filled 2023-03-14: qty 4, 28d supply, fill #3
  Filled 2023-05-15: qty 4, 28d supply, fill #4
  Filled 2023-06-12: qty 4, 28d supply, fill #5
  Filled 2023-07-14: qty 4, 28d supply, fill #6
  Filled 2023-08-02: qty 4, 28d supply, fill #7
  Filled 2023-09-01: qty 4, 28d supply, fill #8
  Filled 2023-11-14: qty 4, 28d supply, fill #9

## 2022-12-14 NOTE — Telephone Encounter (Signed)
Called patient in response to her Dupixent PAP application they advised she now has LIS and will need to go to pharmacy. Called patient nad advised this and submit to [facility] and she wants to get injs in clinic so I will reach out once delivery set to make appt to restart

## 2022-12-15 ENCOUNTER — Other Ambulatory Visit: Payer: Self-pay

## 2022-12-15 DIAGNOSIS — D509 Iron deficiency anemia, unspecified: Secondary | ICD-10-CM

## 2022-12-16 ENCOUNTER — Other Ambulatory Visit (HOSPITAL_COMMUNITY): Payer: Self-pay

## 2022-12-16 MED ORDER — DUPILUMAB 300 MG/2ML ~~LOC~~ SOSY
300.0000 mg | PREFILLED_SYRINGE | SUBCUTANEOUS | Status: AC
  Administered 2022-12-06 – 2024-02-06 (×21): 300 mg via SUBCUTANEOUS

## 2022-12-16 NOTE — Addendum Note (Signed)
Addended by: Carin Hock on: 12/16/2022 12:03 PM   Modules accepted: Orders

## 2022-12-19 ENCOUNTER — Other Ambulatory Visit: Payer: Self-pay

## 2022-12-19 ENCOUNTER — Other Ambulatory Visit (HOSPITAL_COMMUNITY): Payer: Self-pay

## 2022-12-20 ENCOUNTER — Ambulatory Visit (INDEPENDENT_AMBULATORY_CARE_PROVIDER_SITE_OTHER): Payer: Medicare Other

## 2022-12-20 DIAGNOSIS — L209 Atopic dermatitis, unspecified: Secondary | ICD-10-CM

## 2022-12-20 LAB — ANEMIA PROFILE B
Basophils Absolute: 0.1 10*3/uL (ref 0.0–0.2)
Basos: 1 %
EOS (ABSOLUTE): 0.3 10*3/uL (ref 0.0–0.4)
Eos: 4 %
Ferritin: 6 ng/mL — ABNORMAL LOW (ref 15–150)
Folate: 4 ng/mL (ref >3.0)
Hematocrit: 31.9 % — ABNORMAL LOW (ref 34.0–46.6)
Hemoglobin: 9.3 g/dL — ABNORMAL LOW (ref 11.1–15.9)
Immature Grans (Abs): 0 10*3/uL (ref 0.0–0.1)
Immature Granulocytes: 0 %
Iron Saturation: 6 % — CL (ref 15–55)
Iron: 22 ug/dL — ABNORMAL LOW (ref 27–159)
Lymphocytes Absolute: 2 10*3/uL (ref 0.7–3.1)
Lymphs: 27 %
MCH: 23.3 pg — ABNORMAL LOW (ref 26.6–33.0)
MCHC: 29.2 g/dL — ABNORMAL LOW (ref 31.5–35.7)
MCV: 80 fL (ref 79–97)
Monocytes Absolute: 0.6 10*3/uL (ref 0.1–0.9)
Monocytes: 9 %
Neutrophils Absolute: 4.3 10*3/uL (ref 1.4–7.0)
Neutrophils: 59 %
Platelets: 477 10*3/uL — ABNORMAL HIGH (ref 150–450)
RBC: 4 x10E6/uL (ref 3.77–5.28)
RDW: 15 % (ref 11.7–15.4)
Retic Ct Pct: 1 % (ref 0.6–2.6)
Total Iron Binding Capacity: 362 ug/dL (ref 250–450)
UIBC: 340 ug/dL (ref 131–425)
Vitamin B-12: 571 pg/mL (ref 232–1245)
WBC: 7.2 10*3/uL (ref 3.4–10.8)

## 2022-12-21 ENCOUNTER — Other Ambulatory Visit (HOSPITAL_COMMUNITY): Payer: Self-pay

## 2022-12-26 NOTE — Patient Instructions (Incomplete)
  1.  Continue dupilumab  300 mg every 2 weeks.  2. Every day use the following combination:  A. Hydroxyzine 25 mg - 1 tablet 2 times per day B. Famotidine 40 mg - 1 tablet 1 time per day C. Montelukast 10 mg - 1 tablet 1 time per day D. Bath/shower followed by Elidel + mometasone 0.1% ointment 1 time per day   3.  If needed:  A. Albuterol HFA - 2 inhalations every 4-6 hours B. Epi-Pen, benadryl, MD/ER evaluation for allergic reaction  4. Per Dr. Kozlow:Please start vitamin D 400 units/day plus calcium 2 g/day plus alendronate 35 mg 1 time per week.  Will recheck bone density scan in 1 year.   5. Return to clinic in months or earlier if problem  6. No more steroids / prednisone     

## 2022-12-27 ENCOUNTER — Ambulatory Visit: Payer: Medicare Other | Admitting: Family

## 2022-12-29 ENCOUNTER — Other Ambulatory Visit (HOSPITAL_COMMUNITY): Payer: Self-pay

## 2023-01-02 ENCOUNTER — Other Ambulatory Visit (HOSPITAL_COMMUNITY): Payer: Self-pay

## 2023-01-02 NOTE — Patient Instructions (Incomplete)
  1.  Continue dupilumab  300 mg every 2 weeks.  2. Every day use the following combination:  A. Hydroxyzine 25 mg - 1 tablet 2 times per day B. Famotidine 40 mg - 1 tablet 1 time per day C. Montelukast 10 mg - 1 tablet 1 time per day D. Bath/shower followed by Elidel + mometasone 0.1% ointment 1 time per day   3.  If needed:  A. Albuterol HFA - 2 inhalations every 4-6 hours B. Epi-Pen, benadryl, MD/ER evaluation for allergic reaction  4. Per Dr. Kozlow:Please start vitamin D 400 units/day plus calcium 2 g/day plus alendronate 35 mg 1 time per week.  Will recheck bone density scan in 1 year.   5. Return to clinic in months or earlier if problem  6. No more steroids / prednisone

## 2023-01-03 ENCOUNTER — Ambulatory Visit: Payer: Medicare Other | Admitting: Family

## 2023-01-03 ENCOUNTER — Ambulatory Visit (INDEPENDENT_AMBULATORY_CARE_PROVIDER_SITE_OTHER): Payer: Medicare Other

## 2023-01-03 DIAGNOSIS — L209 Atopic dermatitis, unspecified: Secondary | ICD-10-CM | POA: Diagnosis not present

## 2023-01-04 ENCOUNTER — Other Ambulatory Visit (HOSPITAL_COMMUNITY): Payer: Self-pay

## 2023-01-06 ENCOUNTER — Other Ambulatory Visit: Payer: Self-pay

## 2023-01-17 ENCOUNTER — Other Ambulatory Visit: Payer: Self-pay

## 2023-01-17 ENCOUNTER — Ambulatory Visit: Payer: Medicare Other

## 2023-01-17 ENCOUNTER — Ambulatory Visit (INDEPENDENT_AMBULATORY_CARE_PROVIDER_SITE_OTHER): Payer: Medicare Other | Admitting: Internal Medicine

## 2023-01-17 VITALS — BP 124/76 | HR 92 | Temp 97.8°F | Resp 16

## 2023-01-17 DIAGNOSIS — J3089 Other allergic rhinitis: Secondary | ICD-10-CM | POA: Diagnosis not present

## 2023-01-17 DIAGNOSIS — J4530 Mild persistent asthma, uncomplicated: Secondary | ICD-10-CM | POA: Diagnosis not present

## 2023-01-17 DIAGNOSIS — M818 Other osteoporosis without current pathological fracture: Secondary | ICD-10-CM | POA: Diagnosis not present

## 2023-01-17 DIAGNOSIS — L209 Atopic dermatitis, unspecified: Secondary | ICD-10-CM

## 2023-01-17 DIAGNOSIS — L500 Allergic urticaria: Secondary | ICD-10-CM | POA: Diagnosis not present

## 2023-01-17 DIAGNOSIS — T380X5A Adverse effect of glucocorticoids and synthetic analogues, initial encounter: Secondary | ICD-10-CM

## 2023-01-17 DIAGNOSIS — L2089 Other atopic dermatitis: Secondary | ICD-10-CM

## 2023-01-17 MED ORDER — FERROUS SULFATE 325 (65 FE) MG PO TBEC: 325.0000 mg | DELAYED_RELEASE_TABLET | Freq: Three times a day (TID) | ORAL | 1 refills | Status: AC

## 2023-01-17 MED ORDER — MONTELUKAST SODIUM 10 MG PO TABS: 10.0000 mg | ORAL_TABLET | Freq: Every evening | ORAL | 5 refills | Status: DC

## 2023-01-17 MED ORDER — ALENDRONATE SODIUM 35 MG PO TABS: 35.0000 mg | ORAL_TABLET | ORAL | 5 refills | Status: DC

## 2023-01-17 MED ORDER — VENTOLIN HFA 108 (90 BASE) MCG/ACT IN AERS: 2.0000 | INHALATION_SPRAY | RESPIRATORY_TRACT | 1 refills | Status: DC | PRN

## 2023-01-17 MED ORDER — ALBUTEROL SULFATE (2.5 MG/3ML) 0.083% IN NEBU: 2.5000 mg | INHALATION_SOLUTION | RESPIRATORY_TRACT | 1 refills | Status: DC | PRN

## 2023-01-17 NOTE — Patient Instructions (Addendum)
Danielle Bass  Continue Dupilumab 300 mg every 2 weeks.  Use the following combination:  A. Hydroxyzine 25 mg - 1 tablet 2 times per day if hives return B. Famotidine 40 mg - 1 tablet 2 times per day if hives return C. Montelukast 10 mg - 1 tablet 1 time per day D. Bath/shower followed by Elidel + mometasone 0.1% ointment as needed for eczema flare ups.  Do not use mometasone for more than 7 days at a time.   E. Albuterol HFA - 2 inhalations every 4-6 hours as needed for coughing, wheezing, shortness of breath.  F. Epi-Pen, benadryl, MD/ER evaluation for allergic reaction G. Saline rinses as needed   Iron Deficiency Anemia - Please start a simple one a day multi-vitamin + ferrous sulfate 325 - 3 times a day from over the counter.  Will recheck hemoglobin level at next visit.    Osteoporosis - Please start vitamin D 400 units/day plus calcium 2 g/day from over the counter plus alendronate 35 mg 1 time per week - Repeat DEXA in 1 year.  - Avoid steroids/prednisone.

## 2023-01-17 NOTE — Progress Notes (Signed)
FOLLOW UP Date of Service/Encounter:  01/17/23   Subjective:  Danielle Bass (DOB: 1976-06-22) is a 47 y.o. female who [facility] for follow up for eczema, allergic rhinitis, urticaria, mild persistent asthma and steroid induced osteoporosis.   History obtained from: chart review and patient. Last visit was with Dr. Neldon Mc 11/29/2022.  Due to uncontrolled atopic dx, planned to restart Dupixent.  Found to be anemic 12/19/2022 with iron deficiency and discussed iron supplementation; if not improved, heme referral.  Also on Ca and Vit D for steroid induced osteoporosis.    Since last visit, she reports doing a lot better.  Her hives and swelling have mostly resolved.  She was taking Hydroxyzine and Pepcid daily but has not reduced it to just as needed  She is on Singulair daily.  Her eczema is also doing a lot better.  She rarely uses topical Mometasone or Elidel.  She is worried about using Mometasone due to hypopigmentation in the past.  Moisturizes daily with shea butter.  She did report being exposed to strong scented cleaning supplies yesterday that caused her to have some congestion and chest tightness.  No wheezing or coughing.  She doesn't know where her albuterol is and was wondering if she could have a refill.  Outside of this, no other issues with SOB/wheezing/coughing. No ER visits or oral prednisone for breathing issues since last visit.   In terms of the osteoporosis, she never started vit D or Ca and she did not start the Fosfamax because it was not called in.   For iron deficiency anemia, she never started the iron supplement as discussed.   Past Medical History: Past Medical History:  Diagnosis Date   Acid reflux    Asthma    Bronchitis    Chronic back pain    Depression    Eczema     Objective:  BP 124/76   Pulse 92   Temp 97.8 F (36.6 C) (Temporal)   Resp 16   SpO2 100%  There is no height or weight on file to calculate  BMI. Physical Exam: GEN: alert, well developed HEENT: clear conjunctiva, TM grey and translucent, nose with moderate inferior turbinate hypertrophy, pink nasal mucosa, slight clear rhinorrhea, no cobblestoning HEART: regular rate and rhythm, no murmur LUNGS: clear to auscultation bilaterally, no coughing, unlabored respiration SKIN: hypopigmented patch on R arm, no active eczematous patches, no active hives    Assessment:   1. Other atopic dermatitis   2. Other allergic rhinitis   3. Mild persistent asthma without complication   4. Steroid-induced osteoporosis   5. Allergic urticaria   6. Atopic dermatitis, unspecified type     Plan/Recommendations:  Improvement in eczema and urticaria with starting of Dupixent.  Will reduce OAH to PRN.  Increased respiratory symptoms today due to exposure to strong scented cleaners.  This should improve in a few days.  Okay to try Albuterol PRN, I will send a refill.  Can also do saline nasal rinses PRN.   Continue Dupilumab 300 mg every 2 weeks.  Use the following combination:  A. Hydroxyzine 25 mg - 1 tablet 2 times per day if hives return B. Famotidine 40 mg - 1 tablet 2 times per day if hives return C. Montelukast 10 mg - 1 tablet 1 time per day D. Bath/shower followed by Elidel + mometasone 0.1% ointment as needed for eczema flare ups.  Do not use mometasone for more than 7 days  at a time.   E. Albuterol HFA - 2 inhalations every 4-6 hours as needed for coughing, wheezing, shortness of breath.  F. Epi-Pen, benadryl, MD/ER evaluation for allergic reaction  G. Saline rinses as needed  Iron Deficiency Anemia - Please start a simple one a day multi-vitamin + ferrous sulfate 325 - 3 times a day from over the counter.  Will recheck hemoglobin level at next visit.    Osteoporosis - DEXA 12/2022 concerning for osteoporosis, likely steroid induced.  - Please start vitamin D 400 units/day plus calcium 2 g/day from over the counter plus alendronate  35 mg 1 time per week - Repeat DEXA in 1 year.  - Avoid steroids/prednisone.    Return in about 2 months (around 03/18/2023).  Harlon Flor, MD [facility] [place] [facility] [place]

## 2023-01-30 ENCOUNTER — Encounter (HOSPITAL_COMMUNITY): Payer: Self-pay | Admitting: Internal Medicine

## 2023-01-30 ENCOUNTER — Emergency Department (HOSPITAL_COMMUNITY): Payer: Medicare Other

## 2023-01-30 ENCOUNTER — Observation Stay (HOSPITAL_COMMUNITY)
Admission: EM | Admit: 2023-01-30 | Discharge: 2023-01-31 | Disposition: A | Discharge status: Home / Self Care | Payer: Medicare Other | Attending: Internal Medicine | Admitting: Internal Medicine

## 2023-01-30 ENCOUNTER — Other Ambulatory Visit: Payer: Self-pay

## 2023-01-30 DIAGNOSIS — N730 Acute parametritis and pelvic cellulitis: Secondary | ICD-10-CM | POA: Insufficient documentation

## 2023-01-30 DIAGNOSIS — N739 Female pelvic inflammatory disease, unspecified: Secondary | ICD-10-CM | POA: Diagnosis not present

## 2023-01-30 DIAGNOSIS — R1032 Left lower quadrant pain: Secondary | ICD-10-CM | POA: Diagnosis present

## 2023-01-30 DIAGNOSIS — K2970 Gastritis, unspecified, without bleeding: Principal | ICD-10-CM | POA: Insufficient documentation

## 2023-01-30 DIAGNOSIS — Z79899 Other long term (current) drug therapy: Secondary | ICD-10-CM | POA: Insufficient documentation

## 2023-01-30 DIAGNOSIS — K2900 Acute gastritis without bleeding: Secondary | ICD-10-CM | POA: Diagnosis not present

## 2023-01-30 DIAGNOSIS — R8290 Unspecified abnormal findings in urine: Secondary | ICD-10-CM | POA: Diagnosis not present

## 2023-01-30 DIAGNOSIS — R109 Unspecified abdominal pain: Secondary | ICD-10-CM | POA: Diagnosis present

## 2023-01-30 DIAGNOSIS — J4550 Severe persistent asthma, uncomplicated: Secondary | ICD-10-CM | POA: Diagnosis not present

## 2023-01-30 LAB — CBC
HCT: 34.1 % — ABNORMAL LOW (ref 36.0–46.0)
Hemoglobin: 10.2 g/dL — ABNORMAL LOW (ref 12.0–15.0)
MCH: 23.7 pg — ABNORMAL LOW (ref 26.0–34.0)
MCHC: 29.9 g/dL — ABNORMAL LOW (ref 30.0–36.0)
MCV: 79.1 fL — ABNORMAL LOW (ref 80.0–100.0)
Platelets: 299 10*3/uL (ref 150–400)
RBC: 4.31 MIL/uL (ref 3.87–5.11)
RDW: 17.8 % — ABNORMAL HIGH (ref 11.5–15.5)
WBC: 12.8 10*3/uL — ABNORMAL HIGH (ref 4.0–10.5)
nRBC: 0 % (ref 0.0–0.2)

## 2023-01-30 LAB — URINALYSIS, ROUTINE W REFLEX MICROSCOPIC
Bilirubin Urine: NEGATIVE
Glucose, UA: NEGATIVE mg/dL
Ketones, ur: NEGATIVE mg/dL
Leukocytes,Ua: NEGATIVE
Nitrite: NEGATIVE
Protein, ur: 30 mg/dL — AB
RBC / HPF: >50 RBC/hpf (ref 0–5)
Specific Gravity, Urine: 1.017 (ref 1.005–1.030)
pH: 9 — ABNORMAL HIGH (ref 5.0–8.0)

## 2023-01-30 LAB — COMPREHENSIVE METABOLIC PANEL
ALT: 9 U/L (ref 0–44)
AST: 20 U/L (ref 15–41)
Albumin: 3.8 g/dL (ref 3.5–5.0)
Alkaline Phosphatase: 47 U/L (ref 38–126)
Anion gap: 10 (ref 5–15)
BUN: 9 mg/dL (ref 6–20)
CO2: 24 mmol/L (ref 22–32)
Calcium: 9.2 mg/dL (ref 8.9–10.3)
Chloride: 104 mmol/L (ref 98–111)
Creatinine, Ser: 1.16 mg/dL — ABNORMAL HIGH (ref 0.44–1.00)
GFR, Estimated: 59 mL/min — ABNORMAL LOW (ref >60)
Glucose, Bld: 107 mg/dL — ABNORMAL HIGH (ref 70–99)
Potassium: 3.3 mmol/L — ABNORMAL LOW (ref 3.5–5.1)
Sodium: 138 mmol/L (ref 135–145)
Total Bilirubin: 0.7 mg/dL (ref 0.3–1.2)
Total Protein: 7.2 g/dL (ref 6.5–8.1)

## 2023-01-30 LAB — GLUCOSE, CAPILLARY
Glucose-Capillary: 85 mg/dL (ref 70–99)
Glucose-Capillary: 86 mg/dL (ref 70–99)

## 2023-01-30 LAB — I-STAT BETA HCG BLOOD, ED (MC, WL, AP ONLY): I-stat hCG, quantitative: <5 m[IU]/mL (ref <5)

## 2023-01-30 LAB — HIV ANTIBODY (ROUTINE TESTING W REFLEX): HIV Screen 4th Generation wRfx: NONREACTIVE

## 2023-01-30 LAB — LIPASE, BLOOD: Lipase: 38 U/L (ref 11–51)

## 2023-01-30 MED ORDER — MORPHINE SULFATE (PF) 4 MG/ML IV SOLN
4.0000 mg | Freq: Once | INTRAVENOUS | Status: AC
  Administered 2023-01-30: 4 mg via INTRAVENOUS
  Filled 2023-01-30: qty 1

## 2023-01-30 MED ORDER — ACETAMINOPHEN 325 MG PO TABS
650.0000 mg | ORAL_TABLET | Freq: Four times a day (QID) | ORAL | Status: DC | PRN
  Administered 2023-01-30: 650 mg via ORAL
  Filled 2023-01-30: qty 2

## 2023-01-30 MED ORDER — ONDANSETRON HCL 4 MG/2ML IJ SOLN
4.0000 mg | Freq: Four times a day (QID) | INTRAMUSCULAR | Status: DC | PRN
  Administered 2023-01-30: 4 mg via INTRAVENOUS
  Filled 2023-01-30: qty 2

## 2023-01-30 MED ORDER — CEPHALEXIN 500 MG PO CAPS: 500.0000 mg | ORAL_CAPSULE | Freq: Two times a day (BID) | ORAL | 0 refills | Status: DC

## 2023-01-30 MED ORDER — FENTANYL CITRATE PF 50 MCG/ML IJ SOSY
100.0000 ug | PREFILLED_SYRINGE | Freq: Once | INTRAMUSCULAR | Status: AC
  Administered 2023-01-30: 100 ug via INTRAVENOUS
  Filled 2023-01-30: qty 2

## 2023-01-30 MED ORDER — ONDANSETRON 4 MG PO TBDP: 4.0000 mg | ORAL_TABLET | Freq: Three times a day (TID) | ORAL | 0 refills | Status: DC | PRN

## 2023-01-30 MED ORDER — ALBUTEROL SULFATE (2.5 MG/3ML) 0.083% IN NEBU: 2.5000 mg | INHALATION_SOLUTION | RESPIRATORY_TRACT | Status: DC | PRN

## 2023-01-30 MED ORDER — ONDANSETRON HCL 4 MG PO TABS: 4.0000 mg | ORAL_TABLET | Freq: Four times a day (QID) | ORAL | Status: DC | PRN

## 2023-01-30 MED ORDER — ONDANSETRON HCL 4 MG/2ML IJ SOLN
4.0000 mg | Freq: Once | INTRAMUSCULAR | Status: AC
  Administered 2023-01-30: 4 mg via INTRAVENOUS
  Filled 2023-01-30: qty 2

## 2023-01-30 MED ORDER — PANTOPRAZOLE SODIUM 40 MG IV SOLR
40.0000 mg | Freq: Two times a day (BID) | INTRAVENOUS | Status: DC
  Administered 2023-01-30 (×2): 40 mg via INTRAVENOUS
  Filled 2023-01-30 (×2): qty 10

## 2023-01-30 MED ORDER — ENOXAPARIN SODIUM 40 MG/0.4ML IJ SOSY
40.0000 mg | PREFILLED_SYRINGE | INTRAMUSCULAR | Status: DC
  Administered 2023-01-30: 40 mg via SUBCUTANEOUS
  Filled 2023-01-30: qty 0.4

## 2023-01-30 MED ORDER — ALBUTEROL SULFATE HFA 108 (90 BASE) MCG/ACT IN AERS: 2.0000 | INHALATION_SPRAY | RESPIRATORY_TRACT | Status: DC | PRN

## 2023-01-30 MED ORDER — SODIUM CHLORIDE 0.9 % IV SOLN: INTRAVENOUS | Status: DC

## 2023-01-30 MED ORDER — MONTELUKAST SODIUM 10 MG PO TABS
10.0000 mg | ORAL_TABLET | Freq: Every day | ORAL | Status: DC
  Administered 2023-01-30: 10 mg via ORAL
  Filled 2023-01-30: qty 1

## 2023-01-30 MED ORDER — FLUTICASONE PROPIONATE 50 MCG/ACT NA SUSP
1.0000 | Freq: Every day | NASAL | Status: DC
  Administered 2023-01-30: 1 via NASAL
  Filled 2023-01-30: qty 16

## 2023-01-30 MED ORDER — LACTATED RINGERS IV BOLUS
2000.0000 mL | Freq: Once | INTRAVENOUS | Status: AC
  Administered 2023-01-30: 2000 mL via INTRAVENOUS

## 2023-01-30 MED ORDER — MORPHINE SULFATE (PF) 2 MG/ML IV SOLN: 2.0000 mg | INTRAVENOUS | Status: DC | PRN

## 2023-01-30 MED ORDER — IOHEXOL 350 MG/ML SOLN
100.0000 mL | Freq: Once | INTRAVENOUS | Status: AC | PRN
  Administered 2023-01-30: 100 mL via INTRAVENOUS

## 2023-01-30 MED ORDER — ACETAMINOPHEN 650 MG RE SUPP: 650.0000 mg | Freq: Four times a day (QID) | RECTAL | Status: DC | PRN

## 2023-01-30 MED ORDER — OXYCODONE HCL 5 MG PO TABS
5.0000 mg | ORAL_TABLET | ORAL | Status: DC | PRN
  Administered 2023-01-30 (×2): 5 mg via ORAL
  Filled 2023-01-30 (×2): qty 1

## 2023-01-30 MED ORDER — KETOROLAC TROMETHAMINE 30 MG/ML IJ SOLN
30.0000 mg | Freq: Once | INTRAMUSCULAR | Status: AC
  Administered 2023-01-30: 30 mg via INTRAVENOUS
  Filled 2023-01-30: qty 1

## 2023-01-30 MED ORDER — OXYCODONE-ACETAMINOPHEN 5-325 MG PO TABS: 1.0000 | ORAL_TABLET | Freq: Four times a day (QID) | ORAL | 0 refills | Status: DC | PRN

## 2023-01-30 MED ORDER — METOCLOPRAMIDE HCL 5 MG/ML IJ SOLN
10.0000 mg | Freq: Once | INTRAMUSCULAR | Status: AC
  Administered 2023-01-30: 10 mg via INTRAVENOUS
  Filled 2023-01-30: qty 2

## 2023-01-30 MED ORDER — LIDOCAINE 5 % EX PTCH
1.0000 | MEDICATED_PATCH | CUTANEOUS | Status: DC
  Administered 2023-01-30: 1 via TRANSDERMAL
  Filled 2023-01-30: qty 1

## 2023-01-30 MED ORDER — HYDROXYZINE HCL 25 MG PO TABS
25.0000 mg | ORAL_TABLET | Freq: Two times a day (BID) | ORAL | Status: DC
  Administered 2023-01-30 (×2): 25 mg via ORAL
  Filled 2023-01-30 (×2): qty 1

## 2023-01-30 MED ORDER — SODIUM CHLORIDE 0.9 % IV SOLN
1.0000 g | Freq: Once | INTRAVENOUS | Status: AC
  Administered 2023-01-30: 1 g via INTRAVENOUS
  Filled 2023-01-30: qty 10

## 2023-01-30 NOTE — ED Notes (Signed)
CT called to send for patient next.

## 2023-01-30 NOTE — Progress Notes (Signed)
Patient arrived to the unit via bed from ED.  A/O x 4. No pain and distress noted at this time. VSS. Oriented patient to the room and staff. Education provided regarding safety precaution.    Belongings (Cell phone, P[employer] consultant and clothes)  is with the patient.

## 2023-01-30 NOTE — ED Notes (Signed)
Pt placed on purewick 

## 2023-01-30 NOTE — ED Notes (Signed)
Patient transported to [facility] 

## 2023-01-30 NOTE — ED Triage Notes (Signed)
Patient reports left lower abdominal pain radiating to left lower back onset yesterday , no emesis or diarrhea , denies fever or chills .

## 2023-01-30 NOTE — ED Notes (Addendum)
ED TO INPATIENT HANDOFF REPORT  ED Nurse Name and Phone #: [phone]  S Name/Age/Gender Arbutus Ped 47 y.o. female Room/Bed: 038C/038C  Code Status   Code Status: Full Code  Home/SNF/Other Home Patient oriented to: self, place, situation, and time Is this baseline? Yes   Triage Complete: Triage complete  Chief Complaint Gastritis [K29.70]  Triage Note Patient reports left lower abdominal pain radiating to left lower back onset yesterday , no emesis or diarrhea , denies fever or chills .    Allergies Allergies  Allergen Reactions   Justicia Adhatoda (Malabar Nut Tree) [Justicia Adhatoda] Anaphylaxis   Tomato Anaphylaxis   Aspirin Other (See Comments)    Other reaction(s): Unknown "doctor doesn't want her to take" "doctor doesn't want her to take"   Gabapentin Itching   Hydromorphone Other (See Comments)    Drop in bp    Level of Care/Admitting Diagnosis ED Disposition     ED Disposition  [place]: [facility] [[id number]]  Level of Care: Med-Surg [16]  May place patient in observation at [facility] or [place] if equivalent level of care is available:: No  Covid Evaluation: Asymptomatic - no recent exposure (last 10 days) testing not required  Diagnosis: Gastritis [id number]  Admitting Physician: Lequita Halt [id number]  Attending Physician: Lequita Halt [id number]          B Medical/Surgery History Past Medical History:  Diagnosis Date   Acid reflux    Asthma    Bronchitis    Chronic back pain    Depression    Eczema    Past Surgical History:  Procedure Laterality Date   LEG SURGERY     tib fib left?     A IV Location/Drains/Wounds Patient Lines/Drains/Airways Status     Active Line/Drains/Airways     Name Placement date Placement time Site Days   Peripheral IV 01/30/23 20 G Right Antecubital 01/30/23  0414  Antecubital  less than 1            Intake/Output Last 24  hours No intake or output data in the 24 hours ending 01/30/23 1307  Labs/Imaging Results for orders placed or performed during the hospital encounter of 01/30/23 (from the past 48 hour(s))  Lipase, blood     Status: None   Collection Time: 01/30/23  3:23 AM  Result Value Ref Range   Lipase 38 11 - 51 U/L    Comment: Performed at [facility] [address] [place], [place] [address]  Comprehensive metabolic panel     Status: Abnormal   Collection Time: 01/30/23  3:23 AM  Result Value Ref Range   Sodium 138 135 - 145 mmol/L   Potassium 3.3 (L) 3.5 - 5.1 mmol/L   Chloride 104 98 - 111 mmol/L   CO2 24 22 - 32 mmol/L   Glucose, Bld 107 (H) 70 - 99 mg/dL    Comment: Glucose reference range applies only to samples taken after fasting for at least 8 hours.   BUN 9 6 - 20 mg/dL   Creatinine, Ser 1.16 (H) 0.44 - 1.00 mg/dL   Calcium 9.2 8.9 - 10.3 mg/dL   Total Protein 7.2 6.5 - 8.1 g/dL   Albumin 3.8 3.5 - 5.0 g/dL   AST 20 15 - 41 U/L   ALT 9 0 - 44 U/L   Alkaline Phosphatase 47 38 - 126 U/L   Total Bilirubin 0.7  0.3 - 1.2 mg/dL   GFR, Estimated 59 (L) >60 mL/min    Comment: (NOTE) Calculated using the CKD-EPI Creatinine Equation (2021)    Anion gap 10 5 - 15    Comment: Performed at [facility] [address], [place], [place] [address]  CBC     Status: Abnormal   Collection Time: 01/30/23  3:23 AM  Result Value Ref Range   WBC 12.8 (H) 4.0 - 10.5 K/uL    Comment: WHITE COUNT CONFIRMED ON SMEAR   RBC 4.31 3.87 - 5.11 MIL/uL   Hemoglobin 10.2 (L) 12.0 - 15.0 g/dL   HCT 34.1 (L) 36.0 - 46.0 %   MCV 79.1 (L) 80.0 - 100.0 fL   MCH 23.7 (L) 26.0 - 34.0 pg   MCHC 29.9 (L) 30.0 - 36.0 g/dL   RDW 17.8 (H) 11.5 - 15.5 %   Platelets 299 150 - 400 K/uL   nRBC 0.0 0.0 - 0.2 %    Comment: Performed at [facility], [place] [address], [place], [place] [address]  Urinalysis, Routine w reflex microscopic -Urine, Clean Catch     Status: Abnormal   Collection Time:  01/30/23  3:27 AM  Result Value Ref Range   Color, Urine YELLOW YELLOW   APPearance CLOUDY (A) CLEAR   Specific Gravity, Urine 1.017 1.005 - 1.030   pH 9.0 (H) 5.0 - 8.0   Glucose, UA NEGATIVE NEGATIVE mg/dL   Hgb urine dipstick MODERATE (A) NEGATIVE   Bilirubin Urine NEGATIVE NEGATIVE   Ketones, ur NEGATIVE NEGATIVE mg/dL   Protein, ur 30 (A) NEGATIVE mg/dL   Nitrite NEGATIVE NEGATIVE   Leukocytes,Ua NEGATIVE NEGATIVE   RBC / HPF >50 0 - 5 RBC/hpf   WBC, UA 0-5 0 - 5 WBC/hpf   Bacteria, UA MANY (A) NONE SEEN   Squamous Epithelial / HPF 0-5 0 - 5 /HPF   Mucus PRESENT     Comment: Performed at [facility], [address], [place], [place] [address]  I-Stat beta hCG blood, ED     Status: None   Collection Time: 01/30/23  4:06 AM  Result Value Ref Range   I-stat hCG, quantitative <5.0 <5 mIU/mL   Comment 3            Comment:   GEST. AGE      CONC.  (mIU/mL)   <=1 WEEK        5 - 50     2 WEEKS       50 - 500     3 WEEKS       100 - 10,000     4 WEEKS     1,000 - 30,000        FEMALE AND NON-PREGNANT FEMALE:     LESS THAN 5 mIU/mL    US Pelvis Complete  Result Date: 01/30/2023 CLINICAL DATA:  Pelvic pain EXAM: TRANSABDOMINAL AND TRANSVAGINAL ULTRASOUND OF PELVIS DOPPLER ULTRASOUND OF OVARIES TECHNIQUE: Both transabdominal and transvaginal ultrasound examinations of the pelvis were performed. Transabdominal technique was performed for global imaging of the pelvis including uterus, ovaries, adnexal regions, and pelvic cul-de-sac. It was necessary to proceed with endovaginal exam following the transabdominal exam to visualize the uterus, endometrium and ovaries. Color and duplex Doppler ultrasound was utilized to evaluate blood flow to the ovaries. COMPARISON:  08/04/2022 FINDINGS: Uterus Measurements: 9.1 x 4.7 x 6.0 cm = volume: 134.3 mL. No fibroids or other mass visualized. Endometrium Thickness: 4 mm.  No focal abnormality visualized. Right ovary Measurements: 4.3 x 3.6 x 3.4  cm = volume: 23.5 mL. Well-circumscribed, anechoic cyst with increased through transmission measures 3.6 x 3.0 x 3.2 cm. Left ovary Measurements: 2.8 x 1.3 x 2.0 cm = volume: 3.8 mL. Normal appearance/no adnexal mass. Pulsed Doppler evaluation of both ovaries demonstrates normal low-resistance arterial and venous waveforms. Other findings Trace free fluid noted. IMPRESSION: 1. No evidence for ovarian torsion. 2. Simple cyst in the right ovary measures 3.6 cm. Recommend followup US in 3-6 months. No follow up imaging recommended. Note: This recommendation does not apply to premenarchal patients or to those with increased risk (genetic, family history, elevated tumor markers or other high-risk factors) of ovarian cancer. Reference: Radiology 2019 Nov; 293(2):359-371. 3. Trace free fluid in the pelvis is likely physiologic. Electronically Signed   By: Kerby Moors M.D.   On: 01/30/2023 06:39   US Transvaginal Non-OB  Result Date: 01/30/2023 CLINICAL DATA:  Pelvic pain EXAM: TRANSABDOMINAL AND TRANSVAGINAL ULTRASOUND OF PELVIS DOPPLER ULTRASOUND OF OVARIES TECHNIQUE: Both transabdominal and transvaginal ultrasound examinations of the pelvis were performed. Transabdominal technique was performed for global imaging of the pelvis including uterus, ovaries, adnexal regions, and pelvic cul-de-sac. It was necessary to proceed with endovaginal exam following the transabdominal exam to visualize the uterus, endometrium and ovaries. Color and duplex Doppler ultrasound was utilized to evaluate blood flow to the ovaries. COMPARISON:  08/04/2022 FINDINGS: Uterus Measurements: 9.1 x 4.7 x 6.0 cm = volume: 134.3 mL. No fibroids or other mass visualized. Endometrium Thickness: 4 mm.  No focal abnormality visualized. Right ovary Measurements: 4.3 x 3.6 x 3.4 cm = volume: 23.5 mL. Well-circumscribed, anechoic cyst with increased through transmission measures 3.6 x 3.0 x 3.2 cm. Left ovary Measurements: 2.8 x 1.3 x 2.0 cm = volume:  3.8 mL. Normal appearance/no adnexal mass. Pulsed Doppler evaluation of both ovaries demonstrates normal low-resistance arterial and venous waveforms. Other findings Trace free fluid noted. IMPRESSION: 1. No evidence for ovarian torsion. 2. Simple cyst in the right ovary measures 3.6 cm. Recommend followup US in 3-6 months. No follow up imaging recommended. Note: This recommendation does not apply to premenarchal patients or to those with increased risk (genetic, family history, elevated tumor markers or other high-risk factors) of ovarian cancer. Reference: Radiology 2019 Nov; 293(2):359-371. 3. Trace free fluid in the pelvis is likely physiologic. Electronically Signed   By: Kerby Moors M.D.   On: 01/30/2023 06:39   [place] Art/Ven Flow Abd Pelv Doppler  Result Date: 01/30/2023 CLINICAL DATA:  Pelvic pain EXAM: TRANSABDOMINAL AND TRANSVAGINAL ULTRASOUND OF PELVIS DOPPLER ULTRASOUND OF OVARIES TECHNIQUE: Both transabdominal and transvaginal ultrasound examinations of the pelvis were performed. Transabdominal technique was performed for global imaging of the pelvis including uterus, ovaries, adnexal regions, and pelvic cul-de-sac. It was necessary to proceed with endovaginal exam following the transabdominal exam to visualize the uterus, endometrium and ovaries. Color and duplex Doppler ultrasound was utilized to evaluate blood flow to the ovaries. COMPARISON:  08/04/2022 FINDINGS: Uterus Measurements: 9.1 x 4.7 x 6.0 cm = volume: 134.3 mL. No fibroids or other mass visualized. Endometrium Thickness: 4 mm.  No focal abnormality visualized. Right ovary Measurements: 4.3 x 3.6 x 3.4 cm = volume: 23.5 mL. Well-circumscribed, anechoic cyst with increased through transmission measures 3.6 x 3.0 x 3.2 cm. Left ovary Measurements: 2.8 x 1.3 x 2.0 cm = volume: 3.8 mL. Normal appearance/no adnexal mass. Pulsed Doppler evaluation of both ovaries demonstrates normal low-resistance arterial and venous waveforms.  Other  findings Trace free fluid noted. IMPRESSION: 1. No evidence for ovarian torsion. 2. Simple cyst in the right ovary measures 3.6 cm. Recommend followup US in 3-6 months. No follow up imaging recommended. Note: This recommendation does not apply to premenarchal patients or to those with increased risk (genetic, family history, elevated tumor markers or other high-risk factors) of ovarian cancer. Reference: Radiology 2019 Nov; 293(2):359-371. 3. Trace free fluid in the pelvis is likely physiologic. Electronically Signed   By: Kerby Moors M.D.   On: 01/30/2023 06:39   CT Angio Chest/Abd/Pel for Dissection W and/or Wo Contrast  Result Date: 01/30/2023 CLINICAL DATA:  Evaluate for acute aortic syndrome. Patient reports lower abdominal pain radiating to left lower back. Onset yesterday. EXAM: CT ANGIOGRAPHY CHEST, ABDOMEN AND PELVIS TECHNIQUE: Multidetector CT imaging through the chest, abdomen and pelvis was performed using the standard protocol during bolus administration of intravenous contrast. Multiplanar reconstructed images and MIPs were obtained and reviewed to evaluate the vascular anatomy. RADIATION DOSE REDUCTION: This exam was performed according to the departmental dose-optimization program which includes automated exposure control, adjustment of the mA and/or kV according to patient size and/or use of iterative reconstruction technique. CONTRAST:  133m OMNIPAQUE IOHEXOL 350 MG/ML SOLN COMPARISON:  CT AP 01/01/2015 and CT angio chest 12/16/2011. FINDINGS: CTA CHEST FINDINGS Cardiovascular: Satisfactory opacification of the pulmonary arteries to the segmental level. No evidence of pulmonary embolism. Normal heart size. No pericardial effusion. Mediastinum/Nodes: No enlarged mediastinal, hilar, or axillary lymph nodes. Thyroid gland, trachea, and esophagus demonstrate no significant findings. Lungs/Pleura: Lungs are clear. No pleural effusion or pneumothorax. Musculoskeletal: No chest wall abnormality.  No acute or significant osseous findings. Review of the MIP images confirms the above findings. CTA ABDOMEN AND PELVIS FINDINGS VASCULAR Aorta: Normal caliber aorta without aneurysm, dissection, vasculitis or significant stenosis. Celiac: Patent without evidence of aneurysm, dissection, vasculitis or significant stenosis. SMA: Patent without evidence of aneurysm, dissection, vasculitis or significant stenosis. Renals: Both renal arteries are patent without evidence of aneurysm, dissection, vasculitis, fibromuscular dysplasia or significant stenosis. IMA: Patent without evidence of aneurysm, dissection, vasculitis or significant stenosis. Inflow: Patent without evidence of aneurysm, dissection, vasculitis or significant stenosis. Veins: No obvious venous abnormality within the limitations of this arterial phase study. Review of the MIP images confirms the above findings. NON-VASCULAR Hepatobiliary: 9 mm enhancing lesion in lateral segment left lobe of liver is unchanged from 01/01/2015 and is favored to represent a benign hemangioma. No suspicious liver abnormality. Gallbladder appears normal. No bile duct dilatation. Pancreas: Unremarkable. No pancreatic ductal dilatation or surrounding inflammatory changes. Spleen: Normal in size without focal abnormality. Adrenals/Urinary Tract: Adrenal glands are unremarkable. Kidneys are normal, without renal calculi, focal lesion, or hydronephrosis. Bladder is unremarkable. Stomach/Bowel: Stomach appears within normal limits. The appendix is visualized and appears normal. No bowel wall thickening, inflammation or distension. Lymphatic: No abdominopelvic adenopathy. Reproductive: Uterus appears normal. There is a well-circumscribed, fluid density, ovoid fluid density mass measuring 4.0 by 3.0 by 3.4 cm. This appears to arise off the right anterior uterine corpus. No adnexal mass identified. Other: No free fluid or fluid collection. No signs of pneumoperitoneum. Musculoskeletal:  No acute or significant osseous findings. Review of the MIP images confirms the above findings. IMPRESSION: 1. No acute findings within the chest, abdomen or pelvis. 2. No evidence for aortic dissection or aneurysm. 3. Well-circumscribed, fluid density, ovoid fluid density mass appears to arise off the right anterior uterine corpus. This is favored to represent a degenerating uterine fibroid.  Consider more definitive characterization with pelvic sonogram. Electronically Signed   By: Kerby Moors M.D.   On: 01/30/2023 05:33    Pending Labs Unresulted Labs (From admission, onward)     Start     Ordered   01/31/23 0500  CBC  Tomorrow morning,   R        01/30/23 1253   01/30/23 1252  HIV Antibody (routine testing w rflx)  (HIV Antibody (Routine testing w reflex) panel)  Once,   R        01/30/23 1253            Vitals/Pain Today's Vitals   01/30/23 1015 01/30/23 1030 01/30/23 1052 01/30/23 1109  BP: 129/72 123/75    Pulse: (!) 58 61    Resp: 15 14    Temp:    97.7 F (36.5 C)  TempSrc:    Oral  SpO2: 100% 100%    Weight:      Height:      PainSc:   5      Isolation Precautions No active isolations  Medications Medications  pantoprazole (PROTONIX) injection 40 mg (has no administration in time range)  hydrOXYzine (ATARAX) tablet 25 mg (has no administration in time range)  albuterol (PROVENTIL) (2.5 MG/3ML) 0.083% nebulizer solution 2.5 mg (has no administration in time range)  fluticasone (FLONASE) 50 MCG/ACT nasal spray 1 spray (has no administration in time range)  montelukast (SINGULAIR) tablet 10 mg (has no administration in time range)  albuterol (VENTOLIN HFA) 108 (90 Base) MCG/ACT inhaler 2 puff (has no administration in time range)  enoxaparin (LOVENOX) injection 40 mg (has no administration in time range)  0.9 %  sodium chloride infusion (has no administration in time range)  morphine (PF) 2 MG/ML injection 2 mg (has no administration in time range)  oxyCODONE  (Oxy IR/ROXICODONE) immediate release tablet 5 mg (has no administration in time range)  acetaminophen (TYLENOL) tablet 650 mg (has no administration in time range)    Or  acetaminophen (TYLENOL) suppository 650 mg (has no administration in time range)  ondansetron (ZOFRAN) tablet 4 mg (has no administration in time range)    Or  ondansetron (ZOFRAN) injection 4 mg (has no administration in time range)  morphine (PF) 4 MG/ML injection 4 mg (4 mg Intravenous Given 01/30/23 0418)  ondansetron (ZOFRAN) injection 4 mg (4 mg Intravenous Given 01/30/23 0416)  fentaNYL (SUBLIMAZE) injection 100 mcg (100 mcg Intravenous Given 01/30/23 0433)  ketorolac (TORADOL) 30 MG/ML injection 30 mg (30 mg Intravenous Given 01/30/23 0441)  morphine (PF) 4 MG/ML injection 4 mg (4 mg Intravenous Given 01/30/23 0523)  iohexol (OMNIPAQUE) 350 MG/ML injection 100 mL (100 mLs Intravenous Contrast Given 01/30/23 0519)  cefTRIAXone (ROCEPHIN) 1 g in sodium chloride 0.9 % 100 mL IVPB (0 g Intravenous Stopped 01/30/23 0718)  ondansetron (ZOFRAN) injection 4 mg (4 mg Intravenous Given 01/30/23 0651)  lactated ringers bolus 2,000 mL (0 mLs Intravenous Stopped 01/30/23 1055)  metoCLOPramide (REGLAN) injection 10 mg (10 mg Intravenous Given 01/30/23 1019)  morphine (PF) 4 MG/ML injection 4 mg (4 mg Intravenous Given 01/30/23 1019)    Mobility walks     Focused Assessments    R Recommendations: See Admitting Provider Note  Report given to:   Additional Notes:

## 2023-01-30 NOTE — H&P (Signed)
History and Physical    Danielle Bass [id number] DOB: [birth year] DOA: 01/30/2023  PCP: Signa Kell, MD (Confirm with patient/family/NH records and if not entered, this has to be entered at [facility] point of entry) Patient coming from: Home  I have personally briefly reviewed patient's old medical records in [place]  Chief Complaint: Belly hurts  HPI: Danielle Bass is a 47 y.o. female with medical history significant of severe asthma on Dupixent, chronic back pain, left ovary cyst, GERD of antacid, presented with worsening of abdominal pain and nauseous vomiting.  Symptoms started Friday, patient woke up with severe continued leg epigastric pain radiating to left flank and back, worsening with eating and drinking feeling nauseous but no vomiting.  She took OTC medication with some improvement.  No fever no diarrhea.  Saturday morning, patient started to have episodes of vomiting stomach content, and worsening of the abdominal pain, pain is worsening with torso movement, drinking and could not keep food and water down since yesterday.  Started to have episode of chills but no fever.  No diarrhea.  She has been in the early menopausal stage, and her menstrual cycle has been irregular in the last menstrual period was 1 and half months ago.  She also had 1 episode of PID December 2023 when she had foul smell vaginal discharge, but no pain.  This time, she denied any vaginal discharge, and she denied any dysuria or urinary frequency.  ED Course: Vital signs are stable, none tachycardia nonhypotensive afebrile.  ED suspect dissection and ordered CT angiogram which showed no acute findings for dissection or aneurysm but a degenerating uterine fibroid.  Vaginal ultrasound showed no signs of ovarian torsion, stable 3.6 ovary cyst (reported as right side, but all previous image study including CT and ultrasound showed cyst located on the left side ) stable compared to previous study.  UA showed  0-5 WBC, patient was given ceftriaxone in the ED and 2 L of IV bolus stable rounds of Zofran and fentanyl with frequent breakthrough pain  Review of Systems: As per HPI otherwise 14 point review of systems negative.    Past Medical History:  Diagnosis Date   Acid reflux    Asthma    Bronchitis    Chronic back pain    Depression    Eczema     Past Surgical History:  Procedure Laterality Date   LEG SURGERY     tib fib left?     reports that she has never smoked. She has never used smokeless tobacco. She reports that she does not drink alcohol and does not use drugs.  Allergies  Allergen Reactions   Justicia Adhatoda ([place] Nut Tree) [Justicia Adhatoda] Anaphylaxis   Tomato Anaphylaxis   Aspirin Other (See Comments)    Other reaction(s): Unknown "doctor doesn't want her to take" "doctor doesn't want her to take"   Gabapentin Itching   Hydromorphone Other (See Comments)    Drop in bp    Family History  Problem Relation Age of Onset   Macular degeneration Mother      Prior to Admission medications   Medication Sig Start Date End Date Taking? Authorizing Provider  cephALEXin (KEFLEX) 500 MG capsule Take 1 capsule (500 mg total) by mouth 2 (two) times daily. 01/30/23  Yes Montine Circle, PA-C  ondansetron (ZOFRAN-ODT) 4 MG disintegrating tablet Take 1 tablet (4 mg total) by mouth every 8 (eight) hours as needed for nausea or vomiting. 01/30/23  Yes Montine Circle, PA-C  oxyCODONE-acetaminophen (PERCOCET) 5-325 MG tablet Take 1-2 tablets by mouth every 6 (six) hours as needed. 01/30/23  Yes Montine Circle, PA-C  albuterol (PROVENTIL) (2.5 MG/3ML) 0.083% nebulizer solution Take 3 mLs (2.5 mg total) by nebulization every 4 (four) hours as needed for wheezing or shortness of breath. 01/17/23   Larose Kells, MD  alendronate (FOSAMAX) 35 MG tablet Take 1 tablet (35 mg total) by mouth every 7 (seven) days. Take with a full glass of water on an empty stomach. 01/17/23   Larose Kells, MD  EPINEPHrine (EPIPEN 2-PAK) 0.3 mg/0.3 mL IJ SOAJ injection Inject 0.3 mg into the muscle as needed. 11/29/22   Kozlow, Donnamarie Poag, MD  famotidine (PEPCID) 40 MG tablet Take 1 tablet (40 mg total) by mouth daily. Patient not taking: Reported on 01/17/2023 11/29/22   Jiles Prows, MD  ferrous sulfate 325 (65 FE) MG EC tablet Take 1 tablet (325 mg total) by mouth 3 (three) times daily with meals. 01/17/23   Larose Kells, MD  fluticasone (FLONASE) 50 MCG/ACT nasal spray Place 1 spray into both nostrils daily. Begin by using 2 sprays in each nare daily for 3 to 5 days, then decrease to 1 spray in each nare daily. 08/09/22   Lynden Oxford Scales, PA-C  hydrOXYzine (ATARAX) 25 MG tablet Take 1 tablet (25 mg total) by mouth 2 (two) times daily. 11/29/22   Kozlow, Donnamarie Poag, MD  mometasone (ELOCON) 0.1 % ointment Apply topically daily. Apply after Elidel Patient not taking: Reported on 01/17/2023 11/29/22   Jiles Prows, MD  montelukast (SINGULAIR) 10 MG tablet Take 1 tablet (10 mg total) by mouth at bedtime. 01/17/23   Larose Kells, MD  Nebulizer MISC 1 Device by Does not apply route as directed. Patient not taking: Reported on 01/17/2023 11/29/22   Jiles Prows, MD  pimecrolimus (ELIDEL) 1 % cream Apply topically 2 (two) times daily. Apply after bathing Patient not taking: Reported on 01/17/2023 11/29/22   Jiles Prows, MD  VENTOLIN HFA 108 [phone] Base) MCG/ACT inhaler Inhale 2 puffs into the lungs every 4 (four) hours as needed for wheezing or shortness of breath. 01/17/23   Larose Kells, MD    Physical Exam: Vitals:   01/30/23 1000 01/30/23 1015 01/30/23 1030 01/30/23 1109  BP: 128/74 129/72 123/75   Pulse: 66 (!) 58 61   Resp: '13 15 14   '$ Temp:    97.7 F (36.5 C)  TempSrc:    Oral  SpO2: 100% 100% 100%   Weight:      Height:        Constitutional: NAD, calm, comfortable Vitals:   01/30/23 1000 01/30/23 1015 01/30/23 1030 01/30/23 1109  BP: 128/74 129/72 123/75   Pulse: 66 (!) 58 61    Resp: '13 15 14   '$ Temp:    97.7 F (36.5 C)  TempSrc:    Oral  SpO2: 100% 100% 100%   Weight:      Height:       Eyes: PERRL, lids and conjunctivae normal ENMT: Mucous membranes are dry. Posterior pharynx clear of any exudate or lesions.Normal dentition.  Neck: normal, supple, no masses, no thyromegaly Respiratory: clear to auscultation bilaterally, no wheezing, no crackles. Normal respiratory effort. No accessory muscle use.  Cardiovascular: Regular rate and rhythm, no murmurs / rubs / gallops. No extremity edema. 2+ pedal pulses. No carotid bruits.  Abdomen: Mild tenderness on epigastric area, no rebound no guarding, no masses palpated.  No hepatosplenomegaly. Bowel sounds positive.  Musculoskeletal: no clubbing / cyanosis. No joint deformity upper and lower extremities. Good ROM, no contractures. Normal muscle tone.  Skin: no rashes, lesions, ulcers. No induration Neurologic: CN 2-12 grossly intact. Sensation intact, DTR normal. Strength 5/5 in all 4.  Psychiatric: Normal judgment and insight. Alert and oriented x 3. Normal mood.     Labs on Admission: I have personally reviewed following labs and imaging studies  CBC: Recent Labs  Lab 01/30/23 0323  WBC 12.8*  HGB 10.2*  HCT 34.1*  MCV 79.1*  PLT 123XX123   Basic Metabolic Panel: Recent Labs  Lab 01/30/23 0323  NA 138  K 3.3*  CL 104  CO2 24  GLUCOSE 107*  BUN 9  CREATININE 1.16*  CALCIUM 9.2   GFR: Estimated Creatinine Clearance: 61 mL/min (A) (by C-G formula based on SCr of 1.16 mg/dL (H)). Liver Function Tests: Recent Labs  Lab 01/30/23 0323  AST 20  ALT 9  ALKPHOS 47  BILITOT 0.7  PROT 7.2  ALBUMIN 3.8   Recent Labs  Lab 01/30/23 0323  LIPASE 38   No results for input(s): "AMMONIA" in the last 168 hours. Coagulation Profile: No results for input(s): "INR", "PROTIME" in the last 168 hours. Cardiac Enzymes: No results for input(s): "CKTOTAL", "CKMB", "CKMBINDEX", "TROPONINI" in the last 168  hours. BNP (last 3 results) No results for input(s): "PROBNP" in the last 8760 hours. HbA1C: No results for input(s): "HGBA1C" in the last 72 hours. CBG: No results for input(s): "GLUCAP" in the last 168 hours. Lipid Profile: No results for input(s): "CHOL", "HDL", "LDLCALC", "TRIG", "CHOLHDL", "LDLDIRECT" in the last 72 hours. Thyroid Function Tests: No results for input(s): "TSH", "T4TOTAL", "FREET4", "T3FREE", "THYROIDAB" in the last 72 hours. Anemia Panel: No results for input(s): "VITAMINB12", "FOLATE", "FERRITIN", "TIBC", "IRON", "RETICCTPCT" in the last 72 hours. Urine analysis:    Component Value Date/Time   COLORURINE YELLOW 01/30/2023 0327   APPEARANCEUR CLOUDY (A) 01/30/2023 0327   LABSPEC 1.017 01/30/2023 0327   PHURINE 9.0 (H) 01/30/2023 0327   GLUCOSEU NEGATIVE 01/30/2023 0327   HGBUR MODERATE (A) 01/30/2023 0327   BILIRUBINUR NEGATIVE 01/30/2023 0327   KETONESUR NEGATIVE 01/30/2023 0327   PROTEINUR 30 (A) 01/30/2023 0327   UROBILINOGEN 1.0 12/30/2012 2025   NITRITE NEGATIVE 01/30/2023 0327   LEUKOCYTESUR NEGATIVE 01/30/2023 0327    Radiological Exams on Admission: US Pelvis Complete  Result Date: 01/30/2023 CLINICAL DATA:  Pelvic pain EXAM: TRANSABDOMINAL AND TRANSVAGINAL ULTRASOUND OF PELVIS DOPPLER ULTRASOUND OF OVARIES TECHNIQUE: Both transabdominal and transvaginal ultrasound examinations of the pelvis were performed. Transabdominal technique was performed for global imaging of the pelvis including uterus, ovaries, adnexal regions, and pelvic cul-de-sac. It was necessary to proceed with endovaginal exam following the transabdominal exam to visualize the uterus, endometrium and ovaries. Color and duplex Doppler ultrasound was utilized to evaluate blood flow to the ovaries. COMPARISON:  08/04/2022 FINDINGS: Uterus Measurements: 9.1 x 4.7 x 6.0 cm = volume: 134.3 mL. No fibroids or other mass visualized. Endometrium Thickness: 4 mm.  No focal abnormality visualized.  Right ovary Measurements: 4.3 x 3.6 x 3.4 cm = volume: 23.5 mL. Well-circumscribed, anechoic cyst with increased through transmission measures 3.6 x 3.0 x 3.2 cm. Left ovary Measurements: 2.8 x 1.3 x 2.0 cm = volume: 3.8 mL. Normal appearance/no adnexal mass. Pulsed Doppler evaluation of both ovaries demonstrates normal low-resistance arterial and venous waveforms. Other findings Trace free fluid noted. IMPRESSION: 1. No evidence for ovarian torsion. 2. Simple  cyst in the right ovary measures 3.6 cm. Recommend followup US in 3-6 months. No follow up imaging recommended. Note: This recommendation does not apply to premenarchal patients or to those with increased risk (genetic, family history, elevated tumor markers or other high-risk factors) of ovarian cancer. Reference: Radiology 2019 Nov; 293(2):359-371. 3. Trace free fluid in the pelvis is likely physiologic. Electronically Signed   By: Kerby Moors M.D.   On: 01/30/2023 06:39   US Transvaginal Non-OB  Result Date: 01/30/2023 CLINICAL DATA:  Pelvic pain EXAM: TRANSABDOMINAL AND TRANSVAGINAL ULTRASOUND OF PELVIS DOPPLER ULTRASOUND OF OVARIES TECHNIQUE: Both transabdominal and transvaginal ultrasound examinations of the pelvis were performed. Transabdominal technique was performed for global imaging of the pelvis including uterus, ovaries, adnexal regions, and pelvic cul-de-sac. It was necessary to proceed with endovaginal exam following the transabdominal exam to visualize the uterus, endometrium and ovaries. Color and duplex Doppler ultrasound was utilized to evaluate blood flow to the ovaries. COMPARISON:  08/04/2022 FINDINGS: Uterus Measurements: 9.1 x 4.7 x 6.0 cm = volume: 134.3 mL. No fibroids or other mass visualized. Endometrium Thickness: 4 mm.  No focal abnormality visualized. Right ovary Measurements: 4.3 x 3.6 x 3.4 cm = volume: 23.5 mL. Well-circumscribed, anechoic cyst with increased through transmission measures 3.6 x 3.0 x 3.2 cm. Left ovary  Measurements: 2.8 x 1.3 x 2.0 cm = volume: 3.8 mL. Normal appearance/no adnexal mass. Pulsed Doppler evaluation of both ovaries demonstrates normal low-resistance arterial and venous waveforms. Other findings Trace free fluid noted. IMPRESSION: 1. No evidence for ovarian torsion. 2. Simple cyst in the right ovary measures 3.6 cm. Recommend followup US in 3-6 months. No follow up imaging recommended. Note: This recommendation does not apply to premenarchal patients or to those with increased risk (genetic, family history, elevated tumor markers or other high-risk factors) of ovarian cancer. Reference: Radiology 2019 Nov; 293(2):359-371. 3. Trace free fluid in the pelvis is likely physiologic. Electronically Signed   By: Kerby Moors M.D.   On: 01/30/2023 06:39   [place] Art/Ven Flow Abd Pelv Doppler  Result Date: 01/30/2023 CLINICAL DATA:  Pelvic pain EXAM: TRANSABDOMINAL AND TRANSVAGINAL ULTRASOUND OF PELVIS DOPPLER ULTRASOUND OF OVARIES TECHNIQUE: Both transabdominal and transvaginal ultrasound examinations of the pelvis were performed. Transabdominal technique was performed for global imaging of the pelvis including uterus, ovaries, adnexal regions, and pelvic cul-de-sac. It was necessary to proceed with endovaginal exam following the transabdominal exam to visualize the uterus, endometrium and ovaries. Color and duplex Doppler ultrasound was utilized to evaluate blood flow to the ovaries. COMPARISON:  08/04/2022 FINDINGS: Uterus Measurements: 9.1 x 4.7 x 6.0 cm = volume: 134.3 mL. No fibroids or other mass visualized. Endometrium Thickness: 4 mm.  No focal abnormality visualized. Right ovary Measurements: 4.3 x 3.6 x 3.4 cm = volume: 23.5 mL. Well-circumscribed, anechoic cyst with increased through transmission measures 3.6 x 3.0 x 3.2 cm. Left ovary Measurements: 2.8 x 1.3 x 2.0 cm = volume: 3.8 mL. Normal appearance/no adnexal mass. Pulsed Doppler evaluation of both ovaries demonstrates normal low-resistance  arterial and venous waveforms. Other findings Trace free fluid noted. IMPRESSION: 1. No evidence for ovarian torsion. 2. Simple cyst in the right ovary measures 3.6 cm. Recommend followup US in 3-6 months. No follow up imaging recommended. Note: This recommendation does not apply to premenarchal patients or to those with increased risk (genetic, family history, elevated tumor markers or other high-risk factors) of ovarian cancer. Reference: Radiology 2019 Nov; 293(2):359-371. 3. Trace free fluid in the pelvis is likely  physiologic. Electronically Signed   By: Kerby Moors M.D.   On: 01/30/2023 06:39   CT Angio Chest/Abd/Pel for Dissection W and/or Wo Contrast  Result Date: 01/30/2023 CLINICAL DATA:  Evaluate for acute aortic syndrome. Patient reports lower abdominal pain radiating to left lower back. Onset yesterday. EXAM: CT ANGIOGRAPHY CHEST, ABDOMEN AND PELVIS TECHNIQUE: Multidetector CT imaging through the chest, abdomen and pelvis was performed using the standard protocol during bolus administration of intravenous contrast. Multiplanar reconstructed images and MIPs were obtained and reviewed to evaluate the vascular anatomy. RADIATION DOSE REDUCTION: This exam was performed according to the departmental dose-optimization program which includes automated exposure control, adjustment of the mA and/or kV according to patient size and/or use of iterative reconstruction technique. CONTRAST:  111m OMNIPAQUE IOHEXOL 350 MG/ML SOLN COMPARISON:  CT AP 01/01/2015 and CT angio chest 12/16/2011. FINDINGS: CTA CHEST FINDINGS Cardiovascular: Satisfactory opacification of the pulmonary arteries to the segmental level. No evidence of pulmonary embolism. Normal heart size. No pericardial effusion. Mediastinum/Nodes: No enlarged mediastinal, hilar, or axillary lymph nodes. Thyroid gland, trachea, and esophagus demonstrate no significant findings. Lungs/Pleura: Lungs are clear. No pleural effusion or pneumothorax.  Musculoskeletal: No chest wall abnormality. No acute or significant osseous findings. Review of the MIP images confirms the above findings. CTA ABDOMEN AND PELVIS FINDINGS VASCULAR Aorta: Normal caliber aorta without aneurysm, dissection, vasculitis or significant stenosis. Celiac: Patent without evidence of aneurysm, dissection, vasculitis or significant stenosis. SMA: Patent without evidence of aneurysm, dissection, vasculitis or significant stenosis. Renals: Both renal arteries are patent without evidence of aneurysm, dissection, vasculitis, fibromuscular dysplasia or significant stenosis. IMA: Patent without evidence of aneurysm, dissection, vasculitis or significant stenosis. Inflow: Patent without evidence of aneurysm, dissection, vasculitis or significant stenosis. Veins: No obvious venous abnormality within the limitations of this arterial phase study. Review of the MIP images confirms the above findings. NON-VASCULAR Hepatobiliary: 9 mm enhancing lesion in lateral segment left lobe of liver is unchanged from 01/01/2015 and is favored to represent a benign hemangioma. No suspicious liver abnormality. Gallbladder appears normal. No bile duct dilatation. Pancreas: Unremarkable. No pancreatic ductal dilatation or surrounding inflammatory changes. Spleen: Normal in size without focal abnormality. Adrenals/Urinary Tract: Adrenal glands are unremarkable. Kidneys are normal, without renal calculi, focal lesion, or hydronephrosis. Bladder is unremarkable. Stomach/Bowel: Stomach appears within normal limits. The appendix is visualized and appears normal. No bowel wall thickening, inflammation or distension. Lymphatic: No abdominopelvic adenopathy. Reproductive: Uterus appears normal. There is a well-circumscribed, fluid density, ovoid fluid density mass measuring 4.0 by 3.0 by 3.4 cm. This appears to arise off the right anterior uterine corpus. No adnexal mass identified. Other: No free fluid or fluid collection. No  signs of pneumoperitoneum. Musculoskeletal: No acute or significant osseous findings. Review of the MIP images confirms the above findings. IMPRESSION: 1. No acute findings within the chest, abdomen or pelvis. 2. No evidence for aortic dissection or aneurysm. 3. Well-circumscribed, fluid density, ovoid fluid density mass appears to arise off the right anterior uterine corpus. This is favored to represent a degenerating uterine fibroid. Consider more definitive characterization with pelvic sonogram. Electronically Signed   By: TKerby MoorsM.D.   On: 01/30/2023 05:33    EKG: None  Assessment/Plan Principal Problem:   Gastritis Active Problems:   Abdominal pain   Severe persistent asthma   PID (acute pelvic inflammatory disease)  (please populate well all problems here in Problem List. (For example, if patient is on BP meds at home and you resume or decide to  hold them, it is a problem that needs to be her. Same for CAD, COPD, HLD and so on)  Intractable abdominal pain -No clear etiology, clinically suspect gastritis given the symptoms location epigastric area associated with nauseous vomiting, will try PPI.  Continue as needed narcotics and Zofran for symptom management -Continue IV fluid x 1 day -Other DDx, skeletal muscular resource also likely, trial of lidocaine patch -Image study reviewed, dissection, left ovarian torsion ruled out.  PID unlikely with symptoms location and physical exam finding, but will wet prep, while hold off ABX. Doubt UTI, will not continue ABX  Severe asthma -No symptoms signs of flareup, continue outpatient Dupixent and as needed breathing medications  DVT prophylaxis: Lovenox Code Status: Full code Family Communication: None at bedside Disposition Plan: Expect less than 2 midnight hospital day Consults called: None Admission status: MedSurg observation   Lequita Halt MD [facility] Pager [phone]  01/30/2023, 12:54 PM

## 2023-01-30 NOTE — Discharge Instructions (Addendum)
Follow with Primary MD Signa Kell, MD in 7 days   Get CBC, CMP, Magnesium -  checked next visit with your primary MD   Activity: As tolerated with Full fall precautions use walker/cane & assistance as needed  Disposition Home    Diet: Heart Healthy    Special Instructions: If you have smoked or chewed Tobacco  in the last 2 yrs please stop smoking, stop any regular Alcohol  and or any Recreational drug use.  On your next visit with your primary care physician please Get Medicines reviewed and adjusted.  Please request your Prim.MD to go over all Hospital Tests and Procedure/Radiological results at the follow up, please get all Hospital records sent to your Prim MD by signing hospital release before you go home.  If you experience worsening of your admission symptoms, develop shortness of breath, life threatening emergency, suicidal or homicidal thoughts you must seek medical attention immediately by calling 911 or calling your MD immediately  if symptoms less severe.  You Must read complete instructions/literature along with all the possible adverse reactions/side effects for all the Medicines you take and that have been prescribed to you. Take any new Medicines after you have completely understood and accpet all the possible adverse reactions/side effects.

## 2023-01-30 NOTE — ED Notes (Signed)
Patient back from CT.

## 2023-01-30 NOTE — ED Provider Notes (Signed)
Signout received on this 47 year old female.  Please see initial HPI for full details.  In short patient presents with left-sided pain that has been progressively worsening with associated nausea and vomiting.  Plan was to obtain ultrasound and if this is negative patient is appropriate for discharge with symptomatic management.  Although she is not symptom-free she was okay with this plan with the signout team.  On reevaluation patient had an additional episode of vomiting.  Reports she feels lightheaded.  She has not received any IV fluids yet.  Will give her IV hydration and reevaluate.  Ultrasound negative for any acute process.  Physical Exam  BP 116/68 (BP Location: Left Arm)   Pulse 68   Temp (!) 97.5 F (36.4 C) (Oral)   Resp 13   Ht '5\' 5"'$  (1.651 m)   Wt 73.9 kg   SpO2 100%   BMI 27.12 kg/m     Procedures  Procedures  ED Course / MDM    Medical Decision Making Amount and/or Complexity of Data Reviewed Labs: ordered. Radiology: ordered.  Risk Prescription drug management.   Patient without improvement in symptoms.  Reports significant pain still.  Discussed with hospitalist will evaluate patient for admission.       Evlyn Courier, PA-C 01/30/23 1244    Davonna Belling, MD 01/31/23 [id number]

## 2023-01-30 NOTE — ED Provider Notes (Signed)
[facility] Emergency Department Provider Note MRN:  [id number]  Arrival date & time: 01/30/23     Chief Complaint   Abdominal Pain   History of Present Illness   Danielle Bass is a 47 y.o. year-old female presents to the ED with chief complaint of left-sided abdominal pain that radiates into her back.  She also reports some tingling sensation in her arms and legs.  Reports onset of symptoms was 3 days ago.  Reports nausea, but but denies any vomiting or diarrhea.  Denies hematuria or dysuria.  Denies any fevers or chills.  Denies any successful treatments prior to arrival.  History provided by patient.   Review of Systems  Pertinent positive and negative review of systems noted in HPI.    Physical Exam   Vitals:   01/30/23 0317 01/30/23 0415  BP: (!) 141/77 (!) 150/84  Pulse: 79 77  Resp: 20 18  Temp: 97.8 F (36.6 C)   SpO2: 100% 100%    CONSTITUTIONAL:  well-appearing, NAD NEURO:  Alert and oriented x 3, CN 3-12 grossly intact EYES:  eyes equal and reactive ENT/NECK:  Supple, no stridor  CARDIO:  normal rate, regular rhythm, appears well-perfused  PULM:  No respiratory distress, CTAB GI/GU:  non-distended, LLQ TTP MSK/SPINE:  No gross deformities, no edema, moves all extremities  SKIN:  no rash, atraumatic   *Additional and/or pertinent findings included in MDM below  Diagnostic and Interventional Summary    EKG Interpretation  Date/Time:    Ventricular Rate:    PR Interval:    QRS Duration:   QT Interval:    QTC Calculation:   R Axis:     Text Interpretation:         Labs Reviewed  COMPREHENSIVE METABOLIC PANEL - Abnormal; Notable for the following components:      Result Value   Potassium 3.3 (*)    Glucose, Bld 107 (*)    Creatinine, Ser 1.16 (*)    GFR, Estimated 59 (*)    All other components within normal limits  CBC - Abnormal; Notable for the following components:   WBC 12.8 (*)    Hemoglobin 10.2 (*)    HCT  34.1 (*)    MCV 79.1 (*)    MCH 23.7 (*)    MCHC 29.9 (*)    RDW 17.8 (*)    All other components within normal limits  URINALYSIS, ROUTINE W REFLEX MICROSCOPIC - Abnormal; Notable for the following components:   APPearance CLOUDY (*)    pH 9.0 (*)    Hgb urine dipstick MODERATE (*)    Protein, ur 30 (*)    Bacteria, UA MANY (*)    All other components within normal limits  LIPASE, BLOOD  I-STAT BETA HCG BLOOD, ED ([facility], [facility], [facility] ONLY)    CT Angio Chest/Abd/Pel for Dissection W and/or Wo Contrast  Final Result    US Pelvis Complete    (Results Pending)  US Transvaginal Non-OB    (Results Pending)  [place] Art/Ven Flow Abd Pelv Doppler    (Results Pending)    Medications  cefTRIAXone (ROCEPHIN) 1 g in sodium chloride 0.9 % 100 mL IVPB (has no administration in time range)  morphine (PF) 4 MG/ML injection 4 mg (4 mg Intravenous Given 01/30/23 0418)  ondansetron (ZOFRAN) injection 4 mg (4 mg Intravenous Given 01/30/23 0416)  fentaNYL (SUBLIMAZE) injection 100 mcg (100 mcg Intravenous Given 01/30/23 0433)  ketorolac (TORADOL) 30 MG/ML injection 30 mg (30 mg  Intravenous Given 01/30/23 0441)  morphine (PF) 4 MG/ML injection 4 mg (4 mg Intravenous Given 01/30/23 0523)  iohexol (OMNIPAQUE) 350 MG/ML injection 100 mL (100 mLs Intravenous Contrast Given 01/30/23 0519)     Procedures  /  Critical Care Procedures  ED Course and Medical Decision Making  I have reviewed the triage vital signs, the nursing notes, and pertinent available records from the EMR.  Social Determinants Affecting Complexity of Care: Patient has no clinically significant social determinants affecting this chief complaint..   ED Course:    Medical Decision Making Patient here with left-sided abdominal pain.  She is quite tender to palpation.  She has noted to have some hemoglobin and red cells on urinalysis, but denies being on her menstrual cycle.  Question kidney stone.  Additionally patient complains of pain that radiates  into her back along with tingling and numbness in the legs.  She states that she cannot tell if this is new or not.  Will check CT dissection study.  Amount and/or Complexity of Data Reviewed Labs: ordered. Radiology: ordered and independent interpretation performed.    Details: No dissection seen  Risk Prescription drug management.     Consultants: No consultations were needed in caring for this patient.   Treatment and Plan:  Patient signed out to oncoming team.  Plan: Follow-up on [place]     Final Clinical Impressions(s) / ED Diagnoses     ICD-10-CM   1. Left lower quadrant abdominal pain  R10.32     2. Abnormal urinalysis  R82.90       ED Discharge Orders          Ordered    oxyCODONE-acetaminophen (PERCOCET) 5-325 MG tablet  Every 6 hours PRN        01/30/23 0609    ondansetron (ZOFRAN-ODT) 4 MG disintegrating tablet  Every 8 hours PRN        01/30/23 0609    cephALEXin (KEFLEX) 500 MG capsule  2 times daily        01/30/23 0609              Discharge Instructions Discussed with and Provided to Patient:   Discharge Instructions   None      Montine Circle, PA-C 01/30/23 [id number]    Orpah Greek, MD 01/30/23 [phone]

## 2023-01-31 ENCOUNTER — Other Ambulatory Visit (HOSPITAL_COMMUNITY): Payer: Self-pay

## 2023-01-31 ENCOUNTER — Ambulatory Visit: Payer: Medicare Other

## 2023-01-31 DIAGNOSIS — R1012 Left upper quadrant pain: Secondary | ICD-10-CM | POA: Diagnosis not present

## 2023-01-31 DIAGNOSIS — K2900 Acute gastritis without bleeding: Secondary | ICD-10-CM | POA: Diagnosis not present

## 2023-01-31 LAB — CBC
HCT: 28.2 % — ABNORMAL LOW (ref 36.0–46.0)
Hemoglobin: 8.3 g/dL — ABNORMAL LOW (ref 12.0–15.0)
MCH: 23.4 pg — ABNORMAL LOW (ref 26.0–34.0)
MCHC: 29.4 g/dL — ABNORMAL LOW (ref 30.0–36.0)
MCV: 79.4 fL — ABNORMAL LOW (ref 80.0–100.0)
Platelets: 250 10*3/uL (ref 150–400)
RBC: 3.55 MIL/uL — ABNORMAL LOW (ref 3.87–5.11)
RDW: 17.9 % — ABNORMAL HIGH (ref 11.5–15.5)
WBC: 8.1 10*3/uL (ref 4.0–10.5)
nRBC: 0 % (ref 0.0–0.2)

## 2023-01-31 MED ORDER — POTASSIUM CHLORIDE CRYS ER 20 MEQ PO TBCR: 40.0000 meq | EXTENDED_RELEASE_TABLET | Freq: Once | ORAL | Status: DC

## 2023-01-31 MED ORDER — PANTOPRAZOLE SODIUM 40 MG PO TBEC
40.0000 mg | DELAYED_RELEASE_TABLET | Freq: Every day | ORAL | 1 refills | Status: DC
  Filled 2023-01-31: qty 30, 30d supply, fill #0

## 2023-01-31 MED ORDER — OXYCODONE-ACETAMINOPHEN 5-325 MG PO TABS
1.0000 | ORAL_TABLET | Freq: Three times a day (TID) | ORAL | 0 refills | Status: DC | PRN
  Filled 2023-01-31: qty 6, 1d supply, fill #0

## 2023-01-31 MED ORDER — ONDANSETRON 4 MG PO TBDP
4.0000 mg | ORAL_TABLET | Freq: Three times a day (TID) | ORAL | 0 refills | Status: AC | PRN
  Filled 2023-01-31: qty 15, 5d supply, fill #0

## 2023-01-31 MED ORDER — CEPHALEXIN 500 MG PO CAPS
500.0000 mg | ORAL_CAPSULE | Freq: Two times a day (BID) | ORAL | 0 refills | Status: DC
  Filled 2023-01-31: qty 6, 3d supply, fill #0

## 2023-01-31 NOTE — Discharge Summary (Signed)
Danielle Bass [id number] DOB: March 19, 1976 DOA: 01/30/2023  PCP: Signa Kell, MD  Admit date: 01/30/2023  Discharge date: 01/31/2023  Admitted From: Home   Disposition:  Home   Recommendations for Outpatient Follow-up:   Follow up with PCP in 1-2 weeks  PCP Please obtain BMP/CBC, 2 view CXR in 1week,  (see Discharge instructions)   PCP Please follow up on the following pending results: Please arrange for outpatient GI follow-up and OB follow-up in 1 to 2 weeks postdischarge.   Home Health: None   Equipment/Devices: None  Consultations: None  Discharge Condition: Stable    CODE STATUS: Full    Diet Recommendation: Heart Healthy     Chief Complaint  Patient presents with   Abdominal Pain     Brief history of present illness from the day of admission and additional interim summary    47 y.o. female with medical history significant of severe asthma on Dupixent, chronic back pain, left ovary cyst, GERD of antacid, presented with worsening of abdominal pain and nauseous vomiting.   Symptoms started Friday, patient woke up with severe continued leg epigastric pain radiating to left flank and back, worsening with eating and drinking feeling nauseous but no vomiting.  She took OTC medication with some improvement.  No fever no diarrhea.  Saturday morning, patient started to have episodes of vomiting stomach content, and worsening of the abdominal pain, pain is worsening with torso movement, drinking and could not keep food and water down since yesterday.  Started to have episode of chills but no fever.  No diarrhea.  She has been in the early menopausal stage, and her menstrual cycle has been irregular in the last menstrual period was 1 and half months ago.  She also had 1 episode of PID December 2023 when she had  foul smell vaginal discharge, but no pain.  This time, she denied any vaginal discharge, and she denied any dysuria or urinary frequency.  Of note patient states that she has had intermittent similar pain in the left upper quadrant for several years with no clear etiology, in the ER she had CT scan chest abdomen pelvis which was nonacute he also had pelvic and vaginal ultrasound which were stable.  Some nonspecific findings as below.  She was kept in the hospital for further observation.                                                                 Hospital Course   Intermittent recurrent left upper quadrant abdominal pain ongoing for several years.  Present episode lasting few days.  Unclear etiology however gastritis cannot be ruled out, pain is mostly in the left upper quadrant, currently with IV PPI pain has resolved, CT scan chest abdomen pelvis, pelvic and vaginal ultrasound nonacute with some nonspecific  findings.  She is currently feeling much better will be discharged home with supportive care will be placed on PPI, request PCP to arrange for outpatient GI and OB follow-up.  May benefit from EGD and evaluation of her nonspecific uterine and ovarian findings on imaging.  History of underlying asthma.  No acute issues continue home medications.  Possible borderline UTI treated days of Keflex.   Discharge diagnosis     Principal Problem:   Gastritis Active Problems:   Abdominal pain   Severe persistent asthma   PID (acute pelvic inflammatory disease)    Discharge instructions    Discharge Instructions     Diet - low sodium heart healthy   Complete by: As directed    Discharge instructions   Complete by: As directed    Follow with Primary MD Signa Kell, MD in 7 days   Get CBC, CMP, Magnesium -  checked next visit with your primary MD   Activity: As tolerated with Full fall precautions use walker/cane & assistance as needed  Disposition Home    Diet: Heart  Healthy    Special Instructions: If you have smoked or chewed Tobacco  in the last 2 yrs please stop smoking, stop any regular Alcohol  and or any Recreational drug use.  On your next visit with your primary care physician please Get Medicines reviewed and adjusted.  Please request your Prim.MD to go over all Hospital Tests and Procedure/Radiological results at the follow up, please get all Hospital records sent to your Prim MD by signing hospital release before you go home.  If you experience worsening of your admission symptoms, develop shortness of breath, life threatening emergency, suicidal or homicidal thoughts you must seek medical attention immediately by calling 911 or calling your MD immediately  if symptoms less severe.  You Must read complete instructions/literature along with all the possible adverse reactions/side effects for all the Medicines you take and that have been prescribed to you. Take any new Medicines after you have completely understood and accpet all the possible adverse reactions/side effects.   Increase activity slowly   Complete by: As directed        Discharge Medications   Allergies as of 01/31/2023       Reactions   Justicia Adhatoda (malabar Nut Tree) [justicia Adhatoda] Anaphylaxis   Tomato Anaphylaxis   Aspirin Other (See Comments)   Other reaction(s): Unknown "doctor doesn't want her to take" "doctor doesn't want her to take"   Gabapentin Itching   Hydromorphone Other (See Comments)   Drop in bp        Medication List     STOP taking these medications    famotidine 40 MG tablet Commonly known as: PEPCID   mometasone 0.1 % ointment Commonly known as: ELOCON   Nebulizer Misc   pimecrolimus 1 % cream Commonly known as: Elidel       TAKE these medications    albuterol (2.5 MG/3ML) 0.083% nebulizer solution Commonly known as: PROVENTIL Take 3 mLs (2.5 mg total) by nebulization every 4 (four) hours as needed for wheezing or  shortness of breath.   Ventolin HFA 108 (90 Base) MCG/ACT inhaler Generic drug: albuterol Inhale 2 puffs into the lungs every 4 (four) hours as needed for wheezing or shortness of breath.   alendronate 35 MG tablet Commonly known as: FOSAMAX Take 1 tablet (35 mg total) by mouth every 7 (seven) days. Take with a full glass of water on an empty stomach.  cephALEXin 500 MG capsule Commonly known as: KEFLEX Take 1 capsule (500 mg total) by mouth 2 (two) times daily.   EPINEPHrine 0.3 mg/0.3 mL Soaj injection Commonly known as: EpiPen 2-Pak Inject 0.3 mg into the muscle as needed.   ferrous sulfate 325 (65 FE) MG EC tablet Take 1 tablet (325 mg total) by mouth 3 (three) times daily with meals.   fluticasone 50 MCG/ACT nasal spray Commonly known as: FLONASE Place 1 spray into both nostrils daily. Begin by using 2 sprays in each nare daily for 3 to 5 days, then decrease to 1 spray in each nare daily.   hydrOXYzine 25 MG tablet Commonly known as: ATARAX Take 1 tablet (25 mg total) by mouth 2 (two) times daily.   montelukast 10 MG tablet Commonly known as: Singulair Take 1 tablet (10 mg total) by mouth at bedtime.   ondansetron 4 MG disintegrating tablet Commonly known as: ZOFRAN-ODT Take 1 tablet (4 mg total) by mouth every 8 (eight) hours as needed for nausea or vomiting.   oxyCODONE-acetaminophen 5-325 MG tablet Commonly known as: Percocet Take 1-2 tablets by mouth every 8 (eight) hours as needed.   pantoprazole 40 MG tablet Commonly known as: Protonix Take 1 tablet (40 mg total) by mouth daily.         Follow-up Information     Signa Kell, MD. Schedule an appointment as soon as possible for a visit today.   Specialty: Internal Medicine Why: Get a referral for OB physician and GI for this and for follow-up in the next 1 to 2 weeks. Contact information: [place] [address] [phone]                 Major procedures  and Radiology Reports - PLEASE review detailed and final reports thoroughly  -      US Pelvis Complete  Result Date: 01/30/2023 CLINICAL DATA:  Pelvic pain EXAM: TRANSABDOMINAL AND TRANSVAGINAL ULTRASOUND OF PELVIS DOPPLER ULTRASOUND OF OVARIES TECHNIQUE: Both transabdominal and transvaginal ultrasound examinations of the pelvis were performed. Transabdominal technique was performed for global imaging of the pelvis including uterus, ovaries, adnexal regions, and pelvic cul-de-sac. It was necessary to proceed with endovaginal exam following the transabdominal exam to visualize the uterus, endometrium and ovaries. Color and duplex Doppler ultrasound was utilized to evaluate blood flow to the ovaries. COMPARISON:  08/04/2022 FINDINGS: Uterus Measurements: 9.1 x 4.7 x 6.0 cm = volume: 134.3 mL. No fibroids or other mass visualized. Endometrium Thickness: 4 mm.  No focal abnormality visualized. Right ovary Measurements: 4.3 x 3.6 x 3.4 cm = volume: 23.5 mL. Well-circumscribed, anechoic cyst with increased through transmission measures 3.6 x 3.0 x 3.2 cm. Left ovary Measurements: 2.8 x 1.3 x 2.0 cm = volume: 3.8 mL. Normal appearance/no adnexal mass. Pulsed Doppler evaluation of both ovaries demonstrates normal low-resistance arterial and venous waveforms. Other findings Trace free fluid noted. IMPRESSION: 1. No evidence for ovarian torsion. 2. Simple cyst in the right ovary measures 3.6 cm. Recommend followup US in 3-6 months. No follow up imaging recommended. Note: This recommendation does not apply to premenarchal patients or to those with increased risk (genetic, family history, elevated tumor markers or other high-risk factors) of ovarian cancer. Reference: Radiology 2019 Nov; 293(2):359-371. 3. Trace free fluid in the pelvis is likely physiologic. Electronically Signed   By: Kerby Moors M.D.   On: 01/30/2023 06:39   US Transvaginal Non-OB  Result Date: 01/30/2023 CLINICAL DATA:  Pelvic pain  EXAM:  TRANSABDOMINAL AND TRANSVAGINAL ULTRASOUND OF PELVIS DOPPLER ULTRASOUND OF OVARIES TECHNIQUE: Both transabdominal and transvaginal ultrasound examinations of the pelvis were performed. Transabdominal technique was performed for global imaging of the pelvis including uterus, ovaries, adnexal regions, and pelvic cul-de-sac. It was necessary to proceed with endovaginal exam following the transabdominal exam to visualize the uterus, endometrium and ovaries. Color and duplex Doppler ultrasound was utilized to evaluate blood flow to the ovaries. COMPARISON:  08/04/2022 FINDINGS: Uterus Measurements: 9.1 x 4.7 x 6.0 cm = volume: 134.3 mL. No fibroids or other mass visualized. Endometrium Thickness: 4 mm.  No focal abnormality visualized. Right ovary Measurements: 4.3 x 3.6 x 3.4 cm = volume: 23.5 mL. Well-circumscribed, anechoic cyst with increased through transmission measures 3.6 x 3.0 x 3.2 cm. Left ovary Measurements: 2.8 x 1.3 x 2.0 cm = volume: 3.8 mL. Normal appearance/no adnexal mass. Pulsed Doppler evaluation of both ovaries demonstrates normal low-resistance arterial and venous waveforms. Other findings Trace free fluid noted. IMPRESSION: 1. No evidence for ovarian torsion. 2. Simple cyst in the right ovary measures 3.6 cm. Recommend followup US in 3-6 months. No follow up imaging recommended. Note: This recommendation does not apply to premenarchal patients or to those with increased risk (genetic, family history, elevated tumor markers or other high-risk factors) of ovarian cancer. Reference: Radiology 2019 Nov; 293(2):359-371. 3. Trace free fluid in the pelvis is likely physiologic. Electronically Signed   By: Kerby Moors M.D.   On: 01/30/2023 06:39   [place] Art/Ven Flow Abd Pelv Doppler  Result Date: 01/30/2023 CLINICAL DATA:  Pelvic pain EXAM: TRANSABDOMINAL AND TRANSVAGINAL ULTRASOUND OF PELVIS DOPPLER ULTRASOUND OF OVARIES TECHNIQUE: Both transabdominal and transvaginal ultrasound examinations of the  pelvis were performed. Transabdominal technique was performed for global imaging of the pelvis including uterus, ovaries, adnexal regions, and pelvic cul-de-sac. It was necessary to proceed with endovaginal exam following the transabdominal exam to visualize the uterus, endometrium and ovaries. Color and duplex Doppler ultrasound was utilized to evaluate blood flow to the ovaries. COMPARISON:  08/04/2022 FINDINGS: Uterus Measurements: 9.1 x 4.7 x 6.0 cm = volume: 134.3 mL. No fibroids or other mass visualized. Endometrium Thickness: 4 mm.  No focal abnormality visualized. Right ovary Measurements: 4.3 x 3.6 x 3.4 cm = volume: 23.5 mL. Well-circumscribed, anechoic cyst with increased through transmission measures 3.6 x 3.0 x 3.2 cm. Left ovary Measurements: 2.8 x 1.3 x 2.0 cm = volume: 3.8 mL. Normal appearance/no adnexal mass. Pulsed Doppler evaluation of both ovaries demonstrates normal low-resistance arterial and venous waveforms. Other findings Trace free fluid noted. IMPRESSION: 1. No evidence for ovarian torsion. 2. Simple cyst in the right ovary measures 3.6 cm. Recommend followup US in 3-6 months. No follow up imaging recommended. Note: This recommendation does not apply to premenarchal patients or to those with increased risk (genetic, family history, elevated tumor markers or other high-risk factors) of ovarian cancer. Reference: Radiology 2019 Nov; 293(2):359-371. 3. Trace free fluid in the pelvis is likely physiologic. Electronically Signed   By: Kerby Moors M.D.   On: 01/30/2023 06:39   CT Angio Chest/Abd/Pel for Dissection W and/or Wo Contrast  Result Date: 01/30/2023 CLINICAL DATA:  Evaluate for acute aortic syndrome. Patient reports lower abdominal pain radiating to left lower back. Onset yesterday. EXAM: CT ANGIOGRAPHY CHEST, ABDOMEN AND PELVIS TECHNIQUE: Multidetector CT imaging through the chest, abdomen and pelvis was performed using the standard protocol during bolus administration of  intravenous contrast. Multiplanar reconstructed images and MIPs were obtained and reviewed to  evaluate the vascular anatomy. RADIATION DOSE REDUCTION: This exam was performed according to the departmental dose-optimization program which includes automated exposure control, adjustment of the mA and/or kV according to patient size and/or use of iterative reconstruction technique. CONTRAST:  148m OMNIPAQUE IOHEXOL 350 MG/ML SOLN COMPARISON:  CT AP 01/01/2015 and CT angio chest 12/16/2011. FINDINGS: CTA CHEST FINDINGS Cardiovascular: Satisfactory opacification of the pulmonary arteries to the segmental level. No evidence of pulmonary embolism. Normal heart size. No pericardial effusion. Mediastinum/Nodes: No enlarged mediastinal, hilar, or axillary lymph nodes. Thyroid gland, trachea, and esophagus demonstrate no significant findings. Lungs/Pleura: Lungs are clear. No pleural effusion or pneumothorax. Musculoskeletal: No chest wall abnormality. No acute or significant osseous findings. Review of the MIP images confirms the above findings. CTA ABDOMEN AND PELVIS FINDINGS VASCULAR Aorta: Normal caliber aorta without aneurysm, dissection, vasculitis or significant stenosis. Celiac: Patent without evidence of aneurysm, dissection, vasculitis or significant stenosis. SMA: Patent without evidence of aneurysm, dissection, vasculitis or significant stenosis. Renals: Both renal arteries are patent without evidence of aneurysm, dissection, vasculitis, fibromuscular dysplasia or significant stenosis. IMA: Patent without evidence of aneurysm, dissection, vasculitis or significant stenosis. Inflow: Patent without evidence of aneurysm, dissection, vasculitis or significant stenosis. Veins: No obvious venous abnormality within the limitations of this arterial phase study. Review of the MIP images confirms the above findings. NON-VASCULAR Hepatobiliary: 9 mm enhancing lesion in lateral segment left lobe of liver is unchanged from  01/01/2015 and is favored to represent a benign hemangioma. No suspicious liver abnormality. Gallbladder appears normal. No bile duct dilatation. Pancreas: Unremarkable. No pancreatic ductal dilatation or surrounding inflammatory changes. Spleen: Normal in size without focal abnormality. Adrenals/Urinary Tract: Adrenal glands are unremarkable. Kidneys are normal, without renal calculi, focal lesion, or hydronephrosis. Bladder is unremarkable. Stomach/Bowel: Stomach appears within normal limits. The appendix is visualized and appears normal. No bowel wall thickening, inflammation or distension. Lymphatic: No abdominopelvic adenopathy. Reproductive: Uterus appears normal. There is a well-circumscribed, fluid density, ovoid fluid density mass measuring 4.0 by 3.0 by 3.4 cm. This appears to arise off the right anterior uterine corpus. No adnexal mass identified. Other: No free fluid or fluid collection. No signs of pneumoperitoneum. Musculoskeletal: No acute or significant osseous findings. Review of the MIP images confirms the above findings. IMPRESSION: 1. No acute findings within the chest, abdomen or pelvis. 2. No evidence for aortic dissection or aneurysm. 3. Well-circumscribed, fluid density, ovoid fluid density mass appears to arise off the right anterior uterine corpus. This is favored to represent a degenerating uterine fibroid. Consider more definitive characterization with pelvic sonogram. Electronically Signed   By: TKerby MoorsM.D.   On: 01/30/2023 05:33    Micro Results    No results found for this or any previous visit (from the past 240 hour(s)).  Today   Subjective    Ellajane Mizell today has no headache,no chest abdominal pain,no new weakness tingling or numbness, feels much better wants to go home today.    Objective   Blood pressure 113/63, pulse 68, temperature 98.9 F (37.2 C), temperature source Oral, resp. rate 18, height '5\' 5"'$  (1.651 m), weight 73.9 kg, SpO2 100  %.   Intake/Output Summary (Last 24 hours) at 01/31/2023 0821 Last data filed at 01/31/2023 0459 Gross per 24 hour  Intake 1888.32 ml  Output --  Net 1888.32 ml    Exam  Awake Alert, No new F.N deficits,    [place].AT,PERRAL Supple Neck,   Symmetrical Chest wall movement, Good air movement bilaterally, CTAB RRR,No  Gallops,   +ve B.Sounds, Abd Soft, Non tender, no rebound guarding or rigidity, No Cyanosis, Clubbing or edema    Data Review   Recent Labs  Lab 01/30/23 0323 01/31/23 0217  WBC 12.8* 8.1  HGB 10.2* 8.3*  HCT 34.1* 28.2*  PLT 299 250  MCV 79.1* 79.4*  MCH 23.7* 23.4*  MCHC 29.9* 29.4*  RDW 17.8* 17.9*    Recent Labs  Lab 01/30/23 0323  NA 138  K 3.3*  CL 104  CO2 24  ANIONGAP 10  GLUCOSE 107*  BUN 9  CREATININE 1.16*  AST 20  ALT 9  ALKPHOS 47  BILITOT 0.7  ALBUMIN 3.8  CALCIUM 9.2     Total Time in preparing paper work, data evaluation and todays exam - 35 minutes  Signature  -    Lala Lund M.D on 01/31/2023 at 8:21 AM   -  To page go to [email or web address]

## 2023-01-31 NOTE — Care Management Obs Status (Cosign Needed)
[facility] NOTIFICATION   Patient Details  Name: Danielle Bass MRN: [id number] Date of Birth: Aug 04, 1976   Medicare Observation Status Notification Given:  Yes    Curlene Labrum, RN 01/31/2023, 8:42 AM

## 2023-02-02 ENCOUNTER — Other Ambulatory Visit (HOSPITAL_COMMUNITY): Payer: Self-pay

## 2023-02-08 ENCOUNTER — Ambulatory Visit (INDEPENDENT_AMBULATORY_CARE_PROVIDER_SITE_OTHER): Payer: Medicare Other

## 2023-02-08 DIAGNOSIS — L209 Atopic dermatitis, unspecified: Secondary | ICD-10-CM | POA: Diagnosis not present

## 2023-02-14 ENCOUNTER — Other Ambulatory Visit (HOSPITAL_COMMUNITY): Payer: Self-pay

## 2023-02-22 ENCOUNTER — Ambulatory Visit (INDEPENDENT_AMBULATORY_CARE_PROVIDER_SITE_OTHER): Payer: Medicare Other

## 2023-02-22 DIAGNOSIS — L209 Atopic dermatitis, unspecified: Secondary | ICD-10-CM | POA: Diagnosis not present

## 2023-03-09 ENCOUNTER — Ambulatory Visit (INDEPENDENT_AMBULATORY_CARE_PROVIDER_SITE_OTHER): Payer: Medicare Other

## 2023-03-09 DIAGNOSIS — L209 Atopic dermatitis, unspecified: Secondary | ICD-10-CM | POA: Diagnosis not present

## 2023-03-14 ENCOUNTER — Ambulatory Visit: Payer: Medicare Other | Admitting: Allergy and Immunology

## 2023-03-14 ENCOUNTER — Other Ambulatory Visit (HOSPITAL_COMMUNITY): Payer: Self-pay

## 2023-03-23 ENCOUNTER — Ambulatory Visit: Payer: Medicare Other

## 2023-03-28 ENCOUNTER — Other Ambulatory Visit: Payer: Self-pay

## 2023-04-25 ENCOUNTER — Other Ambulatory Visit (HOSPITAL_COMMUNITY): Payer: Self-pay

## 2023-04-27 ENCOUNTER — Other Ambulatory Visit (HOSPITAL_COMMUNITY): Payer: Self-pay

## 2023-05-10 ENCOUNTER — Other Ambulatory Visit: Payer: Self-pay

## 2023-05-15 ENCOUNTER — Other Ambulatory Visit (HOSPITAL_COMMUNITY): Payer: Self-pay

## 2023-05-23 ENCOUNTER — Ambulatory Visit: Payer: Medicare Other | Admitting: Allergy and Immunology

## 2023-05-23 ENCOUNTER — Ambulatory Visit (INDEPENDENT_AMBULATORY_CARE_PROVIDER_SITE_OTHER): Payer: Medicare Other

## 2023-05-23 DIAGNOSIS — L209 Atopic dermatitis, unspecified: Secondary | ICD-10-CM

## 2023-06-08 ENCOUNTER — Ambulatory Visit: Payer: Medicare Other

## 2023-06-08 ENCOUNTER — Other Ambulatory Visit: Payer: Self-pay

## 2023-06-12 ENCOUNTER — Other Ambulatory Visit (HOSPITAL_COMMUNITY): Payer: Self-pay

## 2023-06-12 ENCOUNTER — Other Ambulatory Visit: Payer: Self-pay

## 2023-06-22 ENCOUNTER — Ambulatory Visit (INDEPENDENT_AMBULATORY_CARE_PROVIDER_SITE_OTHER): Payer: Medicare Other

## 2023-06-22 ENCOUNTER — Other Ambulatory Visit: Payer: Self-pay

## 2023-06-22 DIAGNOSIS — L209 Atopic dermatitis, unspecified: Secondary | ICD-10-CM

## 2023-07-06 ENCOUNTER — Ambulatory Visit (INDEPENDENT_AMBULATORY_CARE_PROVIDER_SITE_OTHER): Payer: Medicare Other | Admitting: *Deleted

## 2023-07-06 DIAGNOSIS — L209 Atopic dermatitis, unspecified: Secondary | ICD-10-CM | POA: Diagnosis not present

## 2023-07-12 ENCOUNTER — Other Ambulatory Visit (HOSPITAL_COMMUNITY): Payer: Self-pay

## 2023-07-14 ENCOUNTER — Other Ambulatory Visit (HOSPITAL_COMMUNITY): Payer: Self-pay

## 2023-07-14 ENCOUNTER — Other Ambulatory Visit: Payer: Self-pay

## 2023-07-18 ENCOUNTER — Ambulatory Visit: Payer: Medicare Other

## 2023-07-18 ENCOUNTER — Encounter: Payer: Self-pay | Admitting: Allergy and Immunology

## 2023-07-18 ENCOUNTER — Other Ambulatory Visit: Payer: Self-pay

## 2023-07-18 ENCOUNTER — Ambulatory Visit (INDEPENDENT_AMBULATORY_CARE_PROVIDER_SITE_OTHER): Payer: Medicare Other | Admitting: Allergy and Immunology

## 2023-07-18 VITALS — BP 114/72 | HR 81 | Temp 98.3°F | Resp 17 | Ht 65.75 in | Wt 163.3 lb

## 2023-07-18 DIAGNOSIS — J4530 Mild persistent asthma, uncomplicated: Secondary | ICD-10-CM

## 2023-07-18 DIAGNOSIS — J3089 Other allergic rhinitis: Secondary | ICD-10-CM

## 2023-07-18 DIAGNOSIS — D508 Other iron deficiency anemias: Secondary | ICD-10-CM

## 2023-07-18 DIAGNOSIS — L209 Atopic dermatitis, unspecified: Secondary | ICD-10-CM

## 2023-07-18 DIAGNOSIS — L2089 Other atopic dermatitis: Secondary | ICD-10-CM

## 2023-07-18 DIAGNOSIS — Z91018 Allergy to other foods: Secondary | ICD-10-CM

## 2023-07-18 MED ORDER — MOMETASONE FUROATE 0.1 % EX OINT: TOPICAL_OINTMENT | Freq: Every day | CUTANEOUS | 5 refills | Status: DC

## 2023-07-18 MED ORDER — PIMECROLIMUS 1 % EX CREA: TOPICAL_CREAM | Freq: Two times a day (BID) | CUTANEOUS | 5 refills | Status: DC

## 2023-07-18 NOTE — Progress Notes (Signed)
[place] - [place] - [place] - [place] - [place]   Follow-up Note  Referring Provider: Audery Amel, MD Primary Provider: Audery Amel, MD Date of Office Visit: 07/18/2023  Subjective:   Danielle Bass (DOB: 05/05/1976) is a 47 y.o. female who [facility] in re-evaluation of the following:  HPI: Danielle Bass returns to this clinic in evaluation of atopic dermatitis, asthma, allergic rhinitis, food allergy directed against walnuts, and osteoporosis.  I last saw her in this clinic 29 November 2022 and she visited with Dr. Allena Katz on 17 January 2023.  She is using her dupilumab which results in very good control of her atopic dermatitis however she does occasionally have some activity of her atopic dermatitis especially involving her hands if she gets exposed to irritants and sometimes her shoulders and sometimes around her eye.  She does not really use any topical agents at this point time as she is out of everything.  Her respiratory tract issue has completely resolved and she does not need to use any short acting bronchodilator and does not require any controller agents at this point.  She remains away from consumption of walnuts and she has an EpiPen.  She did not start the alendronate as directed concerning her osteoporosis.  She has been haphazardly using therapy directed at her iron deficiency anemia.  Allergies as of 07/18/2023       Reactions   Justicia Adhatoda (malabar Nut Tree) [justicia Adhatoda] Anaphylaxis   Tomato Anaphylaxis   Aspirin Other (See Comments)   Other reaction(s): Unknown "doctor doesn't want her to take" "doctor doesn't want her to take"   Gabapentin Itching   Hydromorphone Other (See Comments)   Drop in bp        Medication List    albuterol (2.5 MG/3ML) 0.083% nebulizer solution Commonly known as: PROVENTIL Take 3 mLs (2.5 mg total) by nebulization every 4 (four) hours as needed for  wheezing or shortness of breath.   Ventolin HFA 108 (90 Base) MCG/ACT inhaler Generic drug: albuterol Inhale 2 puffs into the lungs every 4 (four) hours as needed for wheezing or shortness of breath.   alendronate 35 MG tablet Commonly known as: FOSAMAX Take 1 tablet (35 mg total) by mouth every 7 (seven) days. Take with a full glass of water on an empty stomach.   cephALEXin 500 MG capsule Commonly known as: KEFLEX Take 1 capsule (500 mg total) by mouth 2 (two) times daily.   Dupixent 300 MG/2ML prefilled syringe Generic drug: dupilumab Inject 600 mg into the skin once for 1 dose. Then 300mg  every 14 days   EPINEPHrine 0.3 mg/0.3 mL Soaj injection Commonly known as: EpiPen 2-Pak Inject 0.3 mg into the muscle as needed.   ferrous sulfate 325 (65 FE) MG EC tablet Take 1 tablet (325 mg total) by mouth 3 (three) times daily with meals.   fluticasone 50 MCG/ACT nasal spray Commonly known as: FLONASE Place 1 spray into both nostrils daily. Begin by using 2 sprays in each nare daily for 3 to 5 days, then decrease to 1 spray in each nare daily.   hydrOXYzine 25 MG tablet Commonly known as: ATARAX Take 1 tablet (25 mg total) by mouth 2 (two) times daily.   montelukast 10 MG tablet Commonly known as: Singulair Take 1 tablet (10 mg total) by mouth at bedtime.   ondansetron 4 MG disintegrating tablet Commonly known as: ZOFRAN-ODT Take 1 tablet (4 mg total) by mouth  every 8 (eight) hours as needed for nausea or vomiting.   oxyCODONE-acetaminophen 5-325 MG tablet Commonly known as: Percocet Take 1-2 tablets by mouth every 8 (eight) hours as needed.   pantoprazole 40 MG tablet Commonly known as: Protonix Take 1 tablet (40 mg total) by mouth daily.    Past Medical History:  Diagnosis Date   Acid reflux    Asthma    Bronchitis    Chronic back pain    Depression    Eczema     Past Surgical History:  Procedure Laterality Date   LEG SURGERY     tib fib left?    Review  of systems negative except as noted in HPI / PMHx or noted below:  Review of Systems  Constitutional: Negative.   HENT: Negative.    Eyes: Negative.   Respiratory: Negative.    Cardiovascular: Negative.   Gastrointestinal: Negative.   Genitourinary: Negative.   Musculoskeletal: Negative.   Skin: Negative.   Neurological: Negative.   Endo/Heme/Allergies: Negative.   Psychiatric/Behavioral: Negative.       Objective:   Vitals:   07/20/23 0918  BP: 114/72  Pulse: 81  Resp: 17  Temp: 98.3 F (36.8 C)  SpO2: 98%   Height: 5' 5.75" (167 cm)  Weight: 163 lb 4.8 oz (74.1 kg)   Physical Exam Constitutional:      Appearance: She is not diaphoretic.  HENT:     Head: Normocephalic.     Right Ear: Tympanic membrane, ear canal and external ear normal.     Left Ear: Tympanic membrane, ear canal and external ear normal.     Nose: Nose normal. No mucosal edema or rhinorrhea.     Mouth/Throat:     Pharynx: Uvula midline. No oropharyngeal exudate.  Eyes:     Conjunctiva/sclera: Conjunctivae normal.  Neck:     Thyroid: No thyromegaly.     Trachea: Trachea normal. No tracheal tenderness or tracheal deviation.  Cardiovascular:     Rate and Rhythm: Normal rate and regular rhythm.     Heart sounds: Normal heart sounds, S1 normal and S2 normal. No murmur heard. Pulmonary:     Effort: No respiratory distress.     Breath sounds: Normal breath sounds. No stridor. No wheezing or rales.  Lymphadenopathy:     Head:     Right side of head: No tonsillar adenopathy.     Left side of head: No tonsillar adenopathy.     Cervical: No cervical adenopathy.  Skin:    Findings: Rash (dorsal hands, left upper posterior arm eczema) present. No erythema.     Nails: There is no clubbing.  Neurological:     Mental Status: She is alert.     Diagnostics:    Spirometry was performed and demonstrated an FEV1 of 2.07 at 79 % of predicted.  The patient had an Asthma Control Test with the following  results: ACT Total Score: 23.    Results of a bone density scan obtained 29 November 2022 identified the following:  DualFemur Total Right 12/14/2022 46.6 -2.5 0.807 g/cm2  AP Spine L1-L4 12/14/2022 46.6 -1.4 1.141 g/cm2  DualFemur Total Mean 12/14/2022 46.6 -2.4 0.812 g/cm2  Results of blood tests obtained 15 December 2022 identified WBC 7.2, absolute eosinophil 300, absolute lymphocyte 2000, hemoglobin 9.3, iron 22 UG/DL, iron saturation 6%, ferritin 6 NG/mL, folate 4.0 NG/mL, B12 571 PG/mL.  Assessment and Plan:   1. Other atopic dermatitis   2. Asthma, well controlled, mild persistent  3. Other allergic rhinitis   4. Food allergy   5. Iron deficiency anemia secondary to inadequate dietary iron intake    1. Continue Dupilumab 300 mg every 2 weeks.  2. If needed:  A. Hydroxyzine 25 mg - 1 tablet 1-2 times per day  B. Bath/shower followed by Elidel + mometasone 0.1% ointment    C. Albuterol HFA - 2 inhalations every 4-6 hours  D. Epi-Pen, benadryl, MD/ER evaluation for allergic reaction  3. Treat osteoporosis:  A. Vitamin D 400 units/day plus calcium 2 g/day  B. Alendronate 35 mg 1 time per week   4. Treat iron deficient anemia:   A. One a day multi-vitamin + ferrous sulfate 325 - 2 times a day.   B. Check Ferritin level today  C. Further evaluation for blood loss???  5. Plan for fall flu vaccine  6. Return to clinic in 6 months or earlier if problem  Danielle Bass appears to be doing better using dupilumab injections regarding her very severe atopic dermatitis and she will remain on this agent at this point in time.  Occasionally she gets a flareup and she can use her Elidel and mometasone.  I have informed her not to use any mometasone on her face and she can rely on the use of Elidel for her face.  And her respiratory tract issues is under very good control with dupilumab as well.  She has osteoporosis and she needs to treat this issue.  She has iron deficiency anemia and she  needs to treat this issue.  I given her instructions regarding both of these issues.  We will check her ferritin today and if it is still low then she probably needs to have evaluation for possible blood loss.  I will contact her with the results of her blood test once they are available for review.  Laurette Schimke, MD Allergy / Immunology [facility]

## 2023-07-18 NOTE — Patient Instructions (Addendum)
  1. Continue Dupilumab 300 mg every 2 weeks.  2. If needed:  A. Hydroxyzine 25 mg - 1 tablet 1-2 times per day  B. Bath/shower followed by Elidel + mometasone 0.1% ointment    C. Albuterol HFA - 2 inhalations every 4-6 hours  D. Epi-Pen, benadryl, MD/ER evaluation for allergic reaction  3. Treat osteoporosis:  A. Vitamin D 400 units/day plus calcium 2 g/day  B. Alendronate 35 mg 1 time per week   4. Treat iron deficient anemia:   A. One a day multi-vitamin + ferrous sulfate 325 - 2 times a day.   B. Check Ferritin level today  C. Further evaluation for blood loss???  5. Plan for fall flu vaccine  6. Return to clinic in 6 months or earlier if problem

## 2023-07-19 ENCOUNTER — Encounter: Payer: Self-pay | Admitting: Allergy and Immunology

## 2023-07-20 ENCOUNTER — Ambulatory Visit: Payer: Medicare Other

## 2023-07-28 LAB — FERRITIN: Ferritin: 5.7 ng/mL — ABNORMAL LOW

## 2023-08-01 ENCOUNTER — Other Ambulatory Visit (HOSPITAL_COMMUNITY): Payer: Self-pay

## 2023-08-01 ENCOUNTER — Ambulatory Visit (INDEPENDENT_AMBULATORY_CARE_PROVIDER_SITE_OTHER): Payer: Medicare Other

## 2023-08-01 DIAGNOSIS — L209 Atopic dermatitis, unspecified: Secondary | ICD-10-CM

## 2023-08-02 ENCOUNTER — Other Ambulatory Visit (HOSPITAL_COMMUNITY): Payer: Self-pay

## 2023-08-14 ENCOUNTER — Ambulatory Visit (INDEPENDENT_AMBULATORY_CARE_PROVIDER_SITE_OTHER): Payer: Medicare Other | Admitting: *Deleted

## 2023-08-14 DIAGNOSIS — L209 Atopic dermatitis, unspecified: Secondary | ICD-10-CM | POA: Diagnosis not present

## 2023-08-28 ENCOUNTER — Ambulatory Visit: Payer: Medicare Other

## 2023-08-28 ENCOUNTER — Encounter: Payer: Self-pay | Admitting: Allergy

## 2023-08-28 DIAGNOSIS — L2089 Other atopic dermatitis: Secondary | ICD-10-CM | POA: Diagnosis not present

## 2023-08-28 DIAGNOSIS — L209 Atopic dermatitis, unspecified: Secondary | ICD-10-CM

## 2023-09-01 ENCOUNTER — Other Ambulatory Visit (HOSPITAL_COMMUNITY): Payer: Self-pay | Admitting: Pharmacy Technician

## 2023-09-01 ENCOUNTER — Other Ambulatory Visit (HOSPITAL_COMMUNITY): Payer: Self-pay

## 2023-09-01 NOTE — Progress Notes (Signed)
Specialty Pharmacy Refill Coordination Note  Bell Carbo is a 47 y.o. female contacted today regarding refills of specialty medication(s) Dupilumab   Patient requested Courier to Provider Office   Delivery date: 09/07/23   [facility] [address]   Medication will be filled on 09/06/23.

## 2023-09-11 ENCOUNTER — Ambulatory Visit: Payer: Medicare Other | Admitting: *Deleted

## 2023-09-11 DIAGNOSIS — L209 Atopic dermatitis, unspecified: Secondary | ICD-10-CM | POA: Diagnosis not present

## 2023-09-25 ENCOUNTER — Ambulatory Visit: Payer: Medicare Other | Admitting: *Deleted

## 2023-09-25 DIAGNOSIS — L209 Atopic dermatitis, unspecified: Secondary | ICD-10-CM | POA: Diagnosis not present

## 2023-10-02 ENCOUNTER — Encounter (HOSPITAL_COMMUNITY): Payer: Self-pay

## 2023-10-02 ENCOUNTER — Other Ambulatory Visit (HOSPITAL_COMMUNITY): Payer: Self-pay

## 2023-10-04 ENCOUNTER — Other Ambulatory Visit (HOSPITAL_COMMUNITY): Payer: Self-pay

## 2023-10-11 ENCOUNTER — Ambulatory Visit: Payer: Medicare Other | Admitting: *Deleted

## 2023-10-11 DIAGNOSIS — L209 Atopic dermatitis, unspecified: Secondary | ICD-10-CM

## 2023-10-26 ENCOUNTER — Ambulatory Visit: Payer: Medicare Other | Admitting: *Deleted

## 2023-10-26 DIAGNOSIS — L209 Atopic dermatitis, unspecified: Secondary | ICD-10-CM

## 2023-11-07 ENCOUNTER — Ambulatory Visit: Payer: Medicare Other

## 2023-11-14 ENCOUNTER — Other Ambulatory Visit: Payer: Self-pay

## 2023-11-14 NOTE — Progress Notes (Signed)
Specialty Pharmacy Refill Coordination Note  Danielle Bass is a 47 y.o. female contacted today regarding refills of specialty medication(s) Dupilumab (DUPIXENT)   Patient requested Courier to Provider Office   Delivery date: 11/20/23   Verified address: [facility] [address] [place], [address]   Medication will be filled on 11/17/23.

## 2023-11-14 NOTE — Progress Notes (Signed)
Specialty Pharmacy Ongoing Clinical Assessment Note  Danielle Bass is a 47 y.o. female who is being followed by the specialty pharmacy service for RxSp Atopic Dermatitis   Patient's specialty medication(s) reviewed today: Dupilumab (DUPIXENT)   Missed doses in the last 4 weeks: 1   Patient/Caregiver did not have any additional questions or concerns.   Therapeutic benefit summary: Patient is achieving benefit   Adverse events/side effects summary: No adverse events/side effects   Patient's therapy is appropriate to: Continue    Goals Addressed             This Visit's Progress    Reduce signs and symptoms       Patient is on track. Patient will maintain adherence         Follow up:  6 months  Otto Herb Specialty Pharmacist

## 2023-11-17 ENCOUNTER — Other Ambulatory Visit: Payer: Self-pay

## 2023-12-14 ENCOUNTER — Other Ambulatory Visit: Payer: Self-pay

## 2024-01-04 ENCOUNTER — Other Ambulatory Visit (HOSPITAL_COMMUNITY): Payer: Self-pay

## 2024-01-11 ENCOUNTER — Ambulatory Visit: Payer: Medicare Other

## 2024-01-11 DIAGNOSIS — L209 Atopic dermatitis, unspecified: Secondary | ICD-10-CM

## 2024-01-23 ENCOUNTER — Ambulatory Visit: Payer: Medicare Other

## 2024-01-23 ENCOUNTER — Other Ambulatory Visit: Payer: Self-pay

## 2024-01-23 ENCOUNTER — Ambulatory Visit (INDEPENDENT_AMBULATORY_CARE_PROVIDER_SITE_OTHER): Payer: Medicare Other | Admitting: Allergy and Immunology

## 2024-01-23 ENCOUNTER — Encounter: Payer: Self-pay | Admitting: Allergy and Immunology

## 2024-01-23 VITALS — BP 130/78 | HR 90 | Temp 98.2°F | Resp 18 | Ht 69.0 in | Wt 165.9 lb

## 2024-01-23 DIAGNOSIS — J4530 Mild persistent asthma, uncomplicated: Secondary | ICD-10-CM

## 2024-01-23 DIAGNOSIS — D509 Iron deficiency anemia, unspecified: Secondary | ICD-10-CM

## 2024-01-23 DIAGNOSIS — Z888 Allergy status to other drugs, medicaments and biological substances status: Secondary | ICD-10-CM

## 2024-01-23 DIAGNOSIS — M818 Other osteoporosis without current pathological fracture: Secondary | ICD-10-CM

## 2024-01-23 DIAGNOSIS — L209 Atopic dermatitis, unspecified: Secondary | ICD-10-CM | POA: Diagnosis not present

## 2024-01-23 MED ORDER — AZELASTINE HCL 0.1 % NA SOLN: 1.0000 | Freq: Two times a day (BID) | NASAL | 1 refills | Status: AC

## 2024-01-23 MED ORDER — VITAMIN D3 10 MCG (400 UNIT) PO TABS: 400.0000 [IU] | ORAL_TABLET | Freq: Every day | ORAL | 1 refills | Status: DC

## 2024-01-23 MED ORDER — ALENDRONATE SODIUM 35 MG PO TABS
35.0000 mg | ORAL_TABLET | ORAL | 1 refills | Status: DC
Start: 2024-01-23 — End: 2024-09-10

## 2024-01-23 MED ORDER — HYDROXYZINE HCL 25 MG PO TABS: ORAL_TABLET | ORAL | 1 refills | Status: DC

## 2024-01-23 MED ORDER — PIMECROLIMUS 1 % EX CREA: TOPICAL_CREAM | Freq: Two times a day (BID) | CUTANEOUS | 5 refills | Status: AC

## 2024-01-23 MED ORDER — EPINEPHRINE 0.3 MG/0.3ML IJ SOAJ: 0.3000 mg | INTRAMUSCULAR | 2 refills | Status: DC | PRN

## 2024-01-23 MED ORDER — MOMETASONE FUROATE 0.1 % EX OINT: TOPICAL_OINTMENT | Freq: Every day | CUTANEOUS | 5 refills | Status: AC

## 2024-01-23 MED ORDER — VENTOLIN HFA 108 (90 BASE) MCG/ACT IN AERS: 2.0000 | INHALATION_SPRAY | RESPIRATORY_TRACT | 1 refills | Status: DC | PRN

## 2024-01-23 NOTE — Patient Instructions (Addendum)
  1. Change dupilumab to nemolizumab injections.  First dose in 2 weeks  2. If needed:  A. Hydroxyzine 25 mg - 1 tablet 1-2 times per day  B. Bath/shower followed by Elidel + mometasone 0.1% ointment    C. Albuterol HFA - 2 inhalations every 4-6 hours  D. Epi-Pen, benadryl, MD/ER evaluation for allergic reaction  3. Treat osteoporosis:  A. Vitamin D 400 units/day plus calcium 2 g/day  B. Alendronate 35 mg 1 time per week   4. Treat and evaluate iron deficient anemia:   A. Visit with a local GI doctor for endoscopies   B. Visit with a local hematologist  C. Continue iron and multivitamin  5. Influenza = Tamiflu. Covid = Paxlovid  6. Return to clinic in 6 months or earlier if problem

## 2024-01-23 NOTE — Progress Notes (Unsigned)
 [place] - [place] - [place] - [place] - [place]   Follow-up Note  Referring Provider: Audery Amel, MD Primary Provider: Audery Amel, MD Date of Office Visit: 01/23/2024  Subjective:   Danielle Bass (DOB: October 08, 1976) is a 48 y.o. female who [facility] 01[facility]24 in re-evaluation of the following:  HPI: Makara returns to this clinic in evaluation of atopic dermatitis, asthma, allergic rhinitis, food allergy directed against walnut, osteoporosis.  I last saw her in this clinic 18 July 2023.  She has had some significant problems with her atopic dermatitis even though she continues on dupilumab and she is required 2 courses of systemic steroids to get this under control.  She just finished a course of systemic steroids for rather significant facial flare.  Her airway issue has been under very good control and she rarely uses a short acting bronchodilator and she has not required a systemic steroid to treat any type of airway issue.  She remains away from consumption of walnuts.  She is not treating her osteoporosis.  When we last saw her in this clinic we rechecked her ferritin and it was still very low and we recommended that she visit with hematology and GI for further evaluation of her rather significant iron deficiency anemia.  She has not engaged in either 1 of these appointments to date.  She lives in [place] and she would like to get this care in a location that is close to [facility].  Allergies as of 01/23/2024       Reactions   Justicia Adhatoda (malabar Nut Tree) [justicia Adhatoda] Anaphylaxis   Tomato Anaphylaxis   Aspirin Other (See Comments)   Other reaction(s): Unknown "doctor doesn't want her to take" "doctor doesn't want her to take"   Gabapentin Itching   Hydromorphone Other (See Comments)   Drop in bp        Medication List    albuterol (2.5 MG/3ML) 0.083% nebulizer solution Commonly known as:  PROVENTIL Take 3 mLs (2.5 mg total) by nebulization every 4 (four) hours as needed for wheezing or shortness of breath.   Ventolin HFA 108 (90 Base) MCG/ACT inhaler Generic drug: albuterol Inhale 2 puffs into the lungs every 4 (four) hours as needed for wheezing or shortness of breath.   alendronate 35 MG tablet Commonly known as: FOSAMAX Take 1 tablet (35 mg total) by mouth every 7 (seven) days. Take with a full glass of water on an empty stomach.   azelastine 0.1 % nasal spray Commonly known as: ASTELIN Place 1 spray into both nostrils 2 (two) times daily.   EPINEPHrine 0.3 mg/0.3 mL Soaj injection Commonly known as: EpiPen 2-Pak Inject 0.3 mg into the muscle as needed.   famotidine 20 MG tablet Commonly known as: PEPCID Take 1 tablet by mouth 2 (two) times daily.   ferrous sulfate 325 (65 FE) MG EC tablet Take 1 tablet (325 mg total) by mouth 3 (three) times daily with meals.   fluticasone 50 MCG/ACT nasal spray Commonly known as: FLONASE Place 1 spray into both nostrils daily. Begin by using 2 sprays in each nare daily for 3 to 5 days, then decrease to 1 spray in each nare daily.   hydrOXYzine 25 MG tablet Commonly known as: ATARAX 1 tablet by mouth 1-2 times a day as needed for eczema flares.   mometasone 0.1 % ointment Commonly known as: ELOCON Apply topically daily.   ondansetron 4 MG disintegrating tablet Commonly known  as: ZOFRAN-ODT Take 1 tablet (4 mg total) by mouth every 8 (eight) hours as needed for nausea or vomiting.   pimecrolimus 1 % cream Commonly known as: Elidel Apply topically 2 (two) times daily.   Vitamin D3 10 MCG (400 UNIT) tablet Take 1 tablet (400 Units total) by mouth daily. Started by: Jessica Priest    Past Medical History:  Diagnosis Date   Acid reflux    Asthma    Bronchitis    Chronic back pain    Depression    Eczema     Past Surgical History:  Procedure Laterality Date   LEG SURGERY     tib fib left?    Review of  systems negative except as noted in HPI / PMHx or noted below:  Review of Systems  Constitutional: Negative.   HENT: Negative.    Eyes: Negative.   Respiratory: Negative.    Cardiovascular: Negative.   Gastrointestinal: Negative.   Genitourinary: Negative.   Musculoskeletal: Negative.   Skin: Negative.   Neurological: Negative.   Endo/Heme/Allergies: Negative.   Psychiatric/Behavioral: Negative.       Objective:   Vitals:   01/23/24 1110  BP: 130/78  Pulse: 90  Resp: 18  Temp: 98.2 F (36.8 C)  SpO2: 100%   Height: 5\' 9"  (175.3 cm)  Weight: 165 lb 14.4 oz (75.3 kg)   Physical Exam Constitutional:      Appearance: She is not diaphoretic.  HENT:     Head: Normocephalic.     Right Ear: Tympanic membrane, ear canal and external ear normal.     Left Ear: Tympanic membrane, ear canal and external ear normal.     Nose: Nose normal. No mucosal edema or rhinorrhea.     Mouth/Throat:     Pharynx: Uvula midline. No oropharyngeal exudate.  Eyes:     Conjunctiva/sclera: Conjunctivae normal.  Neck:     Thyroid: No thyromegaly.     Trachea: Trachea normal. No tracheal tenderness or tracheal deviation.  Cardiovascular:     Rate and Rhythm: Normal rate and regular rhythm.     Heart sounds: Normal heart sounds, S1 normal and S2 normal. No murmur heard. Pulmonary:     Effort: No respiratory distress.     Breath sounds: Normal breath sounds. No stridor. No wheezing or rales.  Lymphadenopathy:     Head:     Right side of head: No tonsillar adenopathy.     Left side of head: No tonsillar adenopathy.     Cervical: No cervical adenopathy.  Skin:    Findings: No erythema or rash (Indurated hyperpigmented erythematous patches face.).     Nails: There is no clubbing.  Neurological:     Mental Status: She is alert.     Diagnostics:    Spirometry was performed and demonstrated an FEV1 of 2.40 at 92 % of predicted.  Results of blood tests obtained 18 July 2023 identified  ferritin 5.7 NG/mL.  Assessment and Plan:   1. Atopic dermatitis, unspecified type   2. Asthma, well controlled, mild persistent   3. Steroid-induced osteoporosis   4. Iron deficiency anemia, unspecified iron deficiency anemia type    1. Change dupilumab to nemolizumab injections.  First dose in 2 weeks  2. If needed:  A. Hydroxyzine 25 mg - 1 tablet 1-2 times per day  B. Bath/shower followed by Elidel + mometasone 0.1% ointment    C. Albuterol HFA - 2 inhalations every 4-6 hours  D. Epi-Pen, benadryl, MD/ER evaluation  for allergic reaction  3. Treat osteoporosis:  A. Vitamin D 400 units/day plus calcium 2 g/day  B. Alendronate 35 mg 1 time per week   4. Treat and evaluate iron deficient anemia:   A. Visit with a local GI doctor for endoscopies   B. Visit with a local hematologist  C. Continue iron and multivitamin  5. Influenza = Tamiflu. Covid = Paxlovid  6. Return to clinic in 6 months or earlier if problem  Fantasy has failed dupilumab to control her atopic dermatitis and we will try her on nemolizumab and should she fail that monoclonal antibody then we will consider starting her on a oral Jak inhibitor.  Fortunately, her airway issue is not very active.  She has osteoporosis and we will start her on alendronate.  She has an iron deficiency anemia for some unknown cause and I asked her to work with her primary care doctor in her local area to get her referred to a GI doctor and hematologist regarding further management of this issue.  I will see her back in this clinic in 6 months.  Laurette Schimke, MD Allergy / Immunology [facility]

## 2024-01-24 ENCOUNTER — Encounter: Payer: Self-pay | Admitting: Allergy and Immunology

## 2024-02-02 ENCOUNTER — Other Ambulatory Visit: Payer: Self-pay

## 2024-02-06 ENCOUNTER — Ambulatory Visit: Admitting: *Deleted

## 2024-02-06 DIAGNOSIS — L209 Atopic dermatitis, unspecified: Secondary | ICD-10-CM

## 2024-02-07 ENCOUNTER — Telehealth: Payer: Self-pay | Admitting: *Deleted

## 2024-02-07 NOTE — Telephone Encounter (Signed)
 Called patient to get part D info but she did not know so will reach out to pharmacy to get same

## 2024-02-07 NOTE — Telephone Encounter (Signed)
 Patient has been scheduled to start Nemluvio on 02/22/24 and will receive 2 samples per Dr. Lucie Leather.

## 2024-02-08 ENCOUNTER — Other Ambulatory Visit: Payer: Self-pay

## 2024-02-08 ENCOUNTER — Other Ambulatory Visit (HOSPITAL_COMMUNITY): Payer: Self-pay

## 2024-02-08 NOTE — Progress Notes (Signed)
 Patient is changing to [facility], which is a limited distribution drug. We cannot order.

## 2024-02-22 ENCOUNTER — Ambulatory Visit: Admitting: *Deleted

## 2024-02-22 DIAGNOSIS — L209 Atopic dermatitis, unspecified: Secondary | ICD-10-CM

## 2024-02-22 MED ORDER — NEMOLIZUMAB-ILTO 30 MG ~~LOC~~ AUIJ
60.0000 mg | AUTO-INJECTOR | Freq: Once | SUBCUTANEOUS | Status: AC
  Administered 2024-02-22: 60 mg via SUBCUTANEOUS

## 2024-02-22 NOTE — Progress Notes (Signed)
 Immunotherapy   Patient Details  Name: Danielle Bass MRN: [id number] Date of Birth: 06-14-1976  02/22/2024  [place] Dragon started injections for  [facility] for Eczema. Patient received a loading dose of 60 mg Nemluvio. Patient waited 15 minutes with no problems.   Frequency Consent signed and patient instructions given.   Dub Mikes 02/22/2024, 4:13 PM

## 2024-07-22 NOTE — Patient Instructions (Incomplete)
  1. Restart nemolizumab injections.  She reports last injection was sometime before summer.  I will send a message to [name], our Biologics coordinator, about you getting your insurance straightened out  2.  Stop Symbicort  80/4.5 mcg START Symbicort  160/4.5 mcg    2 puffs twice a day with spacer to help prevent cough and wheeze. Rinse mouth out afterwards  3. If needed:  A. Hydroxyzine  25 mg - 1 tablet 1-2 times per day  B. Bath/shower followed by Elidel  + mometasone  0.1% ointment    C. Albuterol  HFA - 2 inhalations every 4-6 hours  D. Epi-Pen, benadryl , MD/ER evaluation for allergic reaction E.  Is a nasal spray 1 to 2 sprays in each nostril once a day as needed for stuffy nose. In the right nostril, point the applicator out toward the right ear. In the left nostril, point the applicator out toward the left ear   4. Treat osteoporosis:  A. Vitamin D 400 units/day plus calcium 2 g/day  B. Alendronate  35 mg 1 time per week   5. Treat and evaluate iron deficient anemia:   A. Visit with a local GI doctor for endoscopies   B. Visit with a local hematologist  C. Continue iron and multivitamin  6. Influenza = Tamiflu. Covid = Paxlovid  7. Return to clinic in 4 to 6 weeks with Dr. Kozlow or earlier if problem

## 2024-07-23 ENCOUNTER — Ambulatory Visit (INDEPENDENT_AMBULATORY_CARE_PROVIDER_SITE_OTHER): Admitting: Family

## 2024-07-23 ENCOUNTER — Other Ambulatory Visit: Payer: Self-pay

## 2024-07-23 ENCOUNTER — Encounter: Payer: Self-pay | Admitting: Family

## 2024-07-23 VITALS — BP 120/80 | HR 76 | Temp 97.9°F

## 2024-07-23 DIAGNOSIS — Z91018 Allergy to other foods: Secondary | ICD-10-CM

## 2024-07-23 DIAGNOSIS — M818 Other osteoporosis without current pathological fracture: Secondary | ICD-10-CM | POA: Diagnosis not present

## 2024-07-23 DIAGNOSIS — Z888 Allergy status to other drugs, medicaments and biological substances status: Secondary | ICD-10-CM

## 2024-07-23 DIAGNOSIS — L209 Atopic dermatitis, unspecified: Secondary | ICD-10-CM

## 2024-07-23 DIAGNOSIS — J4530 Mild persistent asthma, uncomplicated: Secondary | ICD-10-CM

## 2024-07-23 DIAGNOSIS — D509 Iron deficiency anemia, unspecified: Secondary | ICD-10-CM

## 2024-07-23 MED ORDER — VENTOLIN HFA 108 (90 BASE) MCG/ACT IN AERS: INHALATION_SPRAY | RESPIRATORY_TRACT | 1 refills | Status: DC

## 2024-07-23 MED ORDER — BUDESONIDE-FORMOTEROL FUMARATE 160-4.5 MCG/ACT IN AERO
INHALATION_SPRAY | RESPIRATORY_TRACT | 5 refills | Status: DC
Start: 2024-07-23 — End: 2024-07-23

## 2024-07-23 MED ORDER — BUDESONIDE-FORMOTEROL FUMARATE 160-4.5 MCG/ACT IN AERO
INHALATION_SPRAY | RESPIRATORY_TRACT | 5 refills | Status: DC
Start: 2024-07-23 — End: 2024-09-10

## 2024-07-23 MED ORDER — SPACER/AERO-HOLDING CHAMBERS DEVI: 0 refills | Status: AC

## 2024-07-23 NOTE — Progress Notes (Signed)
 [address] [place] [address] Dept: [phone]  FOLLOW UP NOTE  Patient ID: Danielle Bass, female    DOB: Aug 24, 1976  Age: 48 y.o. MRN: [id number] Date of Office Visit: 07/23/2024  Assessment  Chief Complaint: Follow-up (Allergies/Asthma ), Nasal Congestion, Wheezing, Cough, and Breathing Problem  HPI Danielle Bass is a 48 year old female who presents today for follow-up of atopic dermatitis, well-controlled mild persistent asthma, steroid-induced osteoporosis, and iron deficiency anemia.  She was last seen on January 23, 2024.  She reports since her last office visit she has been diagnosed with multiple sclerosis.  She denies any surgery since her last office visit.  Atopic dermatitis: She reports that her eczema is having its moment..  She reports that her insurance was messed up, but now she has [employer] as primary and [employer] as secondary.  She thinks her last nemolizumab injection was before the summer.  She reports that today her skin is okay.  She did have a eczema flare on August 11 where she went to urgent care and was given steroids and was given doxycycline  for hidradenitis.  She has Elidel  and mometasone  0.1% ointment to use as needed.  Mild persistent asthma: She reports that her asthma has been flared since being in the hospital in June.  She reports that she was given so much while in the hospital she is not sure.  She does have a Symbicort  80/4.5 mcg inhaler that is empty now.  She reports for the last 4 days she has been using the Symbicort  2 puffs twice a day two  to 3 times a day.  Prior to that she was using the Symbicort  80/4.5 mcg 2 puffs once a day.  She also has lost her albuterol  inhaler.  She reports that when she had her albuterol  it did help with her symptoms.  She reports dry cough, wheeze, tightness in chest, shortness of breath, and nocturnal awakenings due to breathing problems.  She denies fever or chills.  She has not made any trips to the  emergency room or urgent care due to breathing problems.  She reports that she has been on so many steroids since her last office visit.  Steroid-induced osteoporosis.  She reports that she now has a primary care physician and is going to see them on the 18th.  She continues to take vitamin D 400 units a day plus calcium 2 g a day alendronate  35 mg/week.  Iron deficiency anemia: She reports that she has not had time to visit with hematologist or GI doctor.  She reports for the past 3 weeks she has had clear rhinorrhea, nasal congestion, and postnasal drip.  She has fluticasone  nasal spray that she uses as needed.  She does occasionally take Benadryl .  She has tried other antihistamines but they do not help a lot.  She denies any sinus pressure, sinus tenderness, fever, or chills.   Drug Allergies:  Allergies  Allergen Reactions   Justicia Adhatoda (Malabar Nut Tree) [Justicia Adhatoda] Anaphylaxis   Tomato Anaphylaxis   Aspirin Other (See Comments)    Other reaction(s): Unknown doctor doesn't want her to take doctor doesn't want her to take   Gabapentin Itching   Hydromorphone  Other (See Comments)    Drop in bp    Review of Systems: Negative except as per HPI  Physical Exam: BP 120/80   Pulse 76   Temp 97.9 F (36.6 C)   SpO2 100%    Physical Exam Constitutional:  Appearance: Normal appearance.  HENT:     Head: Normocephalic and atraumatic.     Comments: Pharynx normal, eyes normal, ears normal, nose: Bilateral lower turbinates mildly edematous with clear drainage noted    Right Ear: Tympanic membrane, ear canal and external ear normal.     Left Ear: Tympanic membrane, ear canal and external ear normal.     Mouth/Throat:     Mouth: Mucous membranes are moist.     Pharynx: Oropharynx is clear.  Eyes:     Conjunctiva/sclera: Conjunctivae normal.  Cardiovascular:     Rate and Rhythm: Regular rhythm.     Heart sounds: Normal heart sounds.  Pulmonary:     Effort:  Pulmonary effort is normal.     Breath sounds: Normal breath sounds.     Comments: Lungs clear to auscultation Musculoskeletal:     Cervical back: Neck supple.  Skin:    General: Skin is warm.  Neurological:     Mental Status: She is alert and oriented to person, place, and time.  Psychiatric:        Mood and Affect: Mood normal.        Behavior: Behavior normal.        Thought Content: Thought content normal.        Judgment: Judgment normal.     Diagnostics: FVC 2.81 L (87%), FEV1 2.21 L (85%), FEV1/FVC 0.79.  Spirometry indicates normal spirometry.  4 puffs of Xopenex given.  Postbronchodilator response shows FVC 2.90 L (90%), FEV1 2.41 L (93%), FEV1/FVC 0.83.  Spirometry indicates 9% change in FEV1.  Assessment and Plan: 1. Atopic dermatitis, unspecified type   2. Not well controlled mild persistent asthma   3. Food allergy    4. Steroid-induced osteoporosis   5. Iron deficiency anemia, unspecified iron deficiency anemia type     Meds ordered this encounter  Medications   DISCONTD: VENTOLIN  HFA 108 (90 Base) MCG/ACT inhaler    Sig: Inhale 2 puffs every 4-6 hours as needed for cough, wheeze, tightness in chest, or shortness of breath    Dispense:  18 g    Refill:  1   DISCONTD: budesonide -formoterol  (SYMBICORT ) 160-4.5 MCG/ACT inhaler    Sig: Inhale 2 puffs twice a day with spacer to help prevent cough and wheeze.    Dispense:  1 each    Refill:  5   Spacer/Aero-Holding Chambers DEVI    Sig: Symbicort  2 puffs twice a day with spacer to help prevent cough and wheeze. Rinse mouth out afterwards.    Dispense:  1 each    Refill:  0   budesonide -formoterol  (SYMBICORT ) 160-4.5 MCG/ACT inhaler    Sig: Inhale 2 puffs twice a day with spacer to help prevent cough and wheeze    Dispense:  1 each    Refill:  5   VENTOLIN  HFA 108 (90 Base) MCG/ACT inhaler    Sig: Inhale 2 puffs every 4-6 hours as needed for cough, wheeze, tightness in chest, or shortness of breath    Dispense:   18 g    Refill:  1    Patient Instructions   1. Restart nemolizumab injections.  She reports last injection was sometime before summer.  I will send a message to [name], our Biolog[employer] coordinator, about you getting your insurance straightened out  2.  Stop Symbicort  80/4.5 mcg START Symbicort  160/4.5 mcg    2 puffs twice a day with spacer to help prevent cough and wheeze. Rinse mouth out afterwards  3. If needed:  A. Hydroxyzine  25 mg - 1 tablet 1-2 times per day  B. Bath/shower followed by Elidel  + mometasone  0.1% ointment    C. Albuterol  HFA - 2 inhalations every 4-6 hours  D. Epi-Pen, benadryl , MD/ER evaluation for allergic reaction E.  Is a nasal spray 1 to 2 sprays in each nostril once a day as needed for stuffy nose. In the right nostril, point the applicator out toward the right ear. In the left nostril, point the applicator out toward the left ear   4. Treat osteoporosis:  A. Vitamin D 400 units/day plus calcium 2 g/day  B. Alendronate  35 mg 1 time per week   5. Treat and evaluate iron deficient anemia:   A. Visit with a local GI doctor for endoscopies   B. Visit with a local hematologist  C. Continue iron and multivitamin  6. Influenza = Tamiflu. Covid = Paxlovid  7. Return to clinic in 4 to 6 weeks with Dr. Kozlow or earlier if problem  Return in about 6 weeks (around 09/03/2024), or if symptoms worsen or fail to improve.    Thank you for the opportunity to care for this patient.  Please do not hesitate to contact me with questions.  Wanda Craze, FNP [facility] 

## 2024-07-30 ENCOUNTER — Telehealth: Payer: Self-pay | Admitting: Family

## 2024-07-30 NOTE — Telephone Encounter (Signed)
 PT called to advise Ph[facility] on [facility] said they did not have Rxs sent in for PT - I advised PT it appears as though it was filled and sent in, so will have clinical staff review and reach out to hopefully get filled for her today, she thanked

## 2024-08-09 ENCOUNTER — Telehealth: Payer: Self-pay | Admitting: *Deleted

## 2024-08-09 NOTE — Telephone Encounter (Signed)
-----   Message from Wanda Craze sent at 07/23/2024 10:28 AM EDT ----- Patient would like to restart nemolizumab injections for atopic dermatitis. She reports that she now has her insurance straightened out

## 2024-08-09 NOTE — Telephone Encounter (Signed)
 Called patient and advised approval, copay card and submit to [employer] for Nemluvio  with updated dosing instructions for restart

## 2024-08-09 NOTE — Telephone Encounter (Signed)
 Thanks [employer]

## 2024-08-23 NOTE — Progress Notes (Signed)
 Department of Rehabilitation Services  Physical Therapy Treatment Note  Patient: Danielle Bass (Preferred name: [name]), DOB 1976-07-14, MRN [id number]   Visit Date: 08/30/2024  Visit Number: 2  for current Episode of Care Clinic Location:  [facility] [address] [place] [address] Dept: [phone] Loc: [phone]  Encounter Diagnoses:    ICD-10-CM  1. Impairment of balance  R26.89  2. Multiple sclerosis  G35.D    Precautions: Universal  SUBJECTIVE   Doing about the same. Still having balance issues. No falls though. My back is hurting now.   Pain report:   Pain Assessment Pain Assessment %%: 0-10 Pain Score %%:   3 Pain Loc: Back Pain Orientation: Lower     Falls:  Has the patient fallen since last visit: no  Home Exercise Program (HEP): not established    OBJECTIVE     PROMIS CAT - ADULT      07/10/2024   11:12 08/30/2024  ADULT PROMIS CAT  PROMIS Physical Function T-Score 41 43  PROMIS Pain Interference T-Score (range: 10 - 90) 63* 59   For PROMIS measures, higher scores equals more of the concept being measured.  PROMIS scores have a mean of 50 and standard deviation (SD) of 10.  Interpretation of PROMIS Scores WNL Mild Symptoms/ Impairment Moderate Symptoms/ Impairment Severe Symptoms/ Impairment  Physical Function, Upper Extremity Function and Cognitive Function 45 or greater 40-44 30-39 29 or less  Adult Pain Interference, Depression and Sleep Disturbance 54 or less 55-59 60-69 70 or greater    There were no vitals filed for this visit.  TREATMENT   Observations: The patient arrives walking without device. Does not appear to be in any distress. Arrives alone.  PHYSICAL PERFORMANCE MEASURES  12 Item Walking Scale 25/60 41.6%  6 Minute Walk:  Performed on: clinic loop  6 Minute Walk Test 6 Minute Walk Test: Yes Gender: Female (kg/cm) Distance  Walked (Feet): 1169 Distance Walked (Meters) (Calculated): 356.55 meters Height in cm: 167.6 Weight in kg: 78 Patient Age: 48      Female Predicted Distance (meters): 564.58      Percent of Predicted Distance (%): 63.15  RPE 3/10, mild increase in low back pain to R side but not radiating into RLE    Norm: Community ambulation=1043ft/6 minutes  Normative by Age and Gender:  - no norms available for patient's age   Dale et al, 2002)   NEUROMUSCULAR RE-EDUCATION   Pt completed standing dynamic balance:   Toe tapping on 2 Blazepod with each LE, alternating LE; 2 Blazepods with LE crossing midline with each LE, alternating LE; 3 Blazepods at 10-12-2 relative to pt each LE down and back, 10 reps x 1 set, each intervention, specific emphasis on core activation throughout, completed with SBA with minor LOB. Pt able to self correct with minor LOB roughly 100% of time without additional assist  Pt reports no new pain throughout intervention, pain centralized to center of low back with 10-12-2 intervention.    Pt completed ambulating dynamic balance:  Ambulating with C/S rotation at PT direction, C/S flexion<>extension at PT direction, eyes closed, tandem. 120 feet of each intervention.  All with supervision for safety and VC throughout for postural awareness. Pt reports no new pain throughout intervention.    THERAPEUTIC ACTIVITIES  Therapist provided skilled instruction and functional training in the safe and effective management of diagnosis, pain, and symptoms during performance of therapeutic activities  including bed mobility, transfers, ambulation, and performance of ADLs/IADLs. Skilled instruction emphasized use of proper body mechanics, movement strategies, and symptom modulation techniques to promote independence and reduce risk of exacerbation.      EDUCATION   The Patient received education regarding PT Plan of Care and Home Exercises by verbal communication and  demonstration, appeared to show willingness to learn and verbalizes understanding.   ASSESSMENT   Toree demonstrates tolerance for advancements in strengthening/balance program, tolerance for exercises without increase in symptoms/pain, increased fall risk, and decreased balance.  Rikita will continue to benefit from skilled PT services in outpatient setting to progress strengthening, balance, flexibility and functional mobility to maximize independence, prevent further impairments and improve quality of life. 6 Minute Walk Test 63% of expected norms. The patient will continue to benefit from skilled physical therapy to address the above deficits, minimize risk for falls, and maximize ability with functional mobility to improve their overall quality of life.   GOALS   Last Progress Note completed: Yes (07/10/2024 12:01 AM)     Initial Score/Status Goal  Score (if assessed today) Status when last assessed:   Patient will demonstrate an increase in self selected gait speed for improvement in efficiency and safety in navigation of home and community.  0.73 m/sec  by 0.15 m/s (to 0.88 m/sec) 0.73 m/sec  New Goal  Patient will demonstrate an increase on 6 Minute Walk Test indicating improved functional endurance for walking in home/community.  1169 By 65 feet (MCD for MS is 65 feet) 1169 feet New Goal  Patient will demonstrate improved balance and reduced falls risk based on increased DGI score.  16 To 20 (MDC for MS is 4.19-5.54)  16 New Goal  Patient will demonstrate increased confidence in balance and a decreased fall risk as evidenced by improving on Twelve Item MS Walking Scale. 25 <20/60 25 New Goal  Patient (with assist of care partner if indicated) will independently complete a home exercise program to build strength, endurance and balance for improved functional mobility. New Goal  The patient and/or family to be educated regarding principles of energy conservation, falls prevention, and  safe exercise techniques in light of current diagnosis. New Goal  Patient will report no falls between visits. New Goal   PLAN   The following plan for future treatment was agreed upon by the patient and/or family.    Plan: Continue at a frequency of 2x/month  Current POC (if applicable) through: 10/07/2024  Last Progress note/Goal Update: Yes (07/10/2024 12:01 AM)  Plan for next visit(s): progress exercise program  Billing Information: Visit Date: 08/30/2024 Date of Onset: 04/29/24 Visit Number: 2 Session Start:: 1425 Session Stop:: 1510 Total Time: 45 minutes        Neuromuscular Re-Education CPT 97112: 30 minutes Physical Performance Tests CPT 97750: 15 minutes                    Note: Per the evaluating physical therapist's plan of care, if patient does not return for follow up visit(s) related to this episode of care, this note will serve as their discharge note from physical therapy.  If patient returns to clinic with variance in plan of care, then it may be attributable to one or more of the following factors: preferred clinician availability, appointment time request availability, therapy pool appointment availability, major holiday with clinic closure, care partner availability, patient transportation, conflicting medical appointment, inclement weather, patient illness, and/or scheduling error.  ___________________________________________________  Clinical Notes on MyChart: Progress notes documented by your healthcare team will now be available on the MyChart portal.  We believe that patients should be a part of the healthcare team.  We encourage you to review notes after visits and in preparation for upcoming appointments.  This provides the opportunity to review recommendations as well as to prepare questions for your healthcare team to address during your next visit.  If you identify discrepancies in the documentation or have specific  questions related to the notes, please bring them to your next scheduled visit to discuss with your Physical Therapist (PT).  With increased transparency, our hope is that we create more trust, better communication, more shared decision-making, and increased satisfaction. Please be aware that these notes will not be discussed over the phone or through My Chart messages.  They will be discussed only at your next office visit with your provider. *Some images could not be shown.

## 2024-08-27 ENCOUNTER — Ambulatory Visit: Admitting: Allergy and Immunology

## 2024-09-10 ENCOUNTER — Other Ambulatory Visit: Payer: Self-pay

## 2024-09-10 ENCOUNTER — Ambulatory Visit (INDEPENDENT_AMBULATORY_CARE_PROVIDER_SITE_OTHER): Admitting: Allergy and Immunology

## 2024-09-10 ENCOUNTER — Encounter: Payer: Self-pay | Admitting: Allergy and Immunology

## 2024-09-10 VITALS — BP 124/88 | HR 79 | Temp 98.1°F | Resp 16 | Ht 66.0 in | Wt 183.8 lb

## 2024-09-10 DIAGNOSIS — J4540 Moderate persistent asthma, uncomplicated: Secondary | ICD-10-CM

## 2024-09-10 DIAGNOSIS — T7800XD Anaphylactic reaction due to unspecified food, subsequent encounter: Secondary | ICD-10-CM | POA: Diagnosis not present

## 2024-09-10 DIAGNOSIS — L2089 Other atopic dermatitis: Secondary | ICD-10-CM

## 2024-09-10 DIAGNOSIS — T7800XA Anaphylactic reaction due to unspecified food, initial encounter: Secondary | ICD-10-CM

## 2024-09-10 DIAGNOSIS — Z888 Allergy status to other drugs, medicaments and biological substances status: Secondary | ICD-10-CM

## 2024-09-10 DIAGNOSIS — M818 Other osteoporosis without current pathological fracture: Secondary | ICD-10-CM

## 2024-09-10 MED ORDER — ALENDRONATE SODIUM 35 MG PO TABS: 35.0000 mg | ORAL_TABLET | ORAL | 1 refills | Status: AC

## 2024-09-10 MED ORDER — VENTOLIN HFA 108 (90 BASE) MCG/ACT IN AERS: INHALATION_SPRAY | RESPIRATORY_TRACT | 1 refills | Status: AC

## 2024-09-10 MED ORDER — ALBUTEROL SULFATE (2.5 MG/3ML) 0.083% IN NEBU: 2.5000 mg | INHALATION_SOLUTION | RESPIRATORY_TRACT | 1 refills | Status: AC | PRN

## 2024-09-10 MED ORDER — EPINEPHRINE 0.3 MG/0.3ML IJ SOAJ: 0.3000 mg | INTRAMUSCULAR | 2 refills | Status: AC | PRN

## 2024-09-10 MED ORDER — VITAMIN D3 10 MCG (400 UNIT) PO TABS: 400.0000 [IU] | ORAL_TABLET | Freq: Every day | ORAL | 1 refills | Status: AC

## 2024-09-10 MED ORDER — NEMOLIZUMAB-ILTO 30 MG ~~LOC~~ AUIJ
30.0000 mg | AUTO-INJECTOR | Freq: Once | SUBCUTANEOUS | Status: AC
  Administered 2024-09-10: 30 mg via SUBCUTANEOUS

## 2024-09-10 MED ORDER — HYDROXYZINE HCL 25 MG PO TABS: ORAL_TABLET | ORAL | 1 refills | Status: DC

## 2024-09-10 MED ORDER — BUDESONIDE-FORMOTEROL FUMARATE 160-4.5 MCG/ACT IN AERO: INHALATION_SPRAY | RESPIRATORY_TRACT | 5 refills | Status: AC

## 2024-09-10 NOTE — Patient Instructions (Addendum)
  1.  START nemolizumab injections.  2.  Continue Symbicort  160 - 2 inhalations twice a day with spacer   3.  If needed:  A. Hydroxyzine  25 mg - 1 tablet 1-2 times per day  B. Bath/shower followed by Elidel  + mometasone  0.1% ointment    C. Albuterol  HFA - 2 inhalations every 4-6 hours  D. Epi-Pen, benadryl , MD/ER evaluation for allergic reaction  4.  Continue to treat osteoporosis:  A. Vitamin D 400 units/day plus calcium 2 g/day  B. Alendronate  35 mg 1 time per week   5. Influenza = Tamiflu. Covid = Paxlovid  6. Return to clinic in 12 weeks or earlier if problem. Change treatment for atopic dermatitis???

## 2024-09-10 NOTE — Progress Notes (Unsigned)
 [facility] [place] [facility] [place] - [place] - [place]   Follow-up Note  Referring Provider: Rolinda Lynwood Lunger, MD Primary Provider: Rolinda Lynwood Lunger, MD Date of Office Visit: 09/10/2024  Subjective:   Danielle Bass (DOB: 1976/01/12) is a 48 y.o. female who [facility] in re-evaluation of the following:  HPI: Katrinia returns to this clinic in evaluation of atopic dermatitis, asthma, allergic rhinitis, food allergy  directed against walnuts, and osteoporosis.  I last saw her in this clinic for March 2025.  She was seen by our nurse practitioner 23 July 2024 at which point in time she was encouraged to restart her nemolizumab injections.  Apparently she did not receive her Symbicort  after her last visit and she ended up in the urgent care center on 30 July 2024 and received systemic steroids for a asthma exacerbation.  Fortunately, she appears to be doing quite well with her asthma at this point in time and has no symptoms and does not require a short acting bronchodilator where she has been consistently using her Symbicort .  Likewise, she has very little issues with her nose at this point.  Her skin has been a mass.  She unfortunately has not started her monoclonal IL-31 antibody.  She does not eat walnuts.  She continues to treat osteoporosis with alendronate .  Allergies as of 09/10/2024       Reactions   Justicia Adhatoda (malabar Nut Tree) [justicia Adhatoda] Anaphylaxis   Tomato Anaphylaxis   Aspirin Other (See Comments)   Other reaction(s): Unknown doctor doesn't want her to take doctor doesn't want her to take   Gabapentin Itching   Hydromorphone  Other (See Comments)   Drop in bp        Medication List    albuterol  (2.5 MG/3ML) 0.083% nebulizer solution Commonly known as: PROVENTIL  Take 3 mLs (2.5 mg total) by nebulization every 4 (four) hours as needed for wheezing or shortness of breath.   Ventolin   HFA 108 (90 Base) MCG/ACT inhaler Generic drug: albuterol  Inhale 2 puffs every 4-6 hours as needed for cough, wheeze, tightness in chest, or shortness of breath   alendronate  35 MG tablet Commonly known as: FOSAMAX  Take 1 tablet (35 mg total) by mouth every 7 (seven) days. Take with a full glass of water on an empty stomach.   ARIPiprazole 10 MG tablet Commonly known as: ABILIFY Take 10 mg by mouth.   azelastine  0.1 % nasal spray Commonly known as: ASTELIN  Place 1 spray into both nostrils 2 (two) times daily.   Baclofen 5 MG Tabs Take 15 mg by mouth.   budesonide -formoterol  160-4.5 MCG/ACT inhaler Commonly known as: Symbicort  Inhale 2 puffs twice a day with spacer to help prevent cough and wheeze   busPIRone 15 MG tablet Commonly known as: BUSPAR Take 15 mg by mouth.   doxycycline  100 MG capsule Commonly known as: VIBRAMYCIN  Take 100 mg by mouth 2 (two) times daily.   EPINEPHrine  0.3 mg/0.3 mL Soaj injection Commonly known as: EpiPen  2-Pak Inject 0.3 mg into the muscle as needed.   famotidine  20 MG tablet Commonly known as: PEPCID  Take 1 tablet by mouth 2 (two) times daily.   ferrous sulfate  325 (65 FE) MG EC tablet Take 1 tablet (325 mg total) by mouth 3 (three) times daily with meals.   fluticasone  50 MCG/ACT nasal spray Commonly known as: FLONASE  Place 1 spray into both nostrils daily. Begin by using 2 sprays in each nare daily for 3  to 5 days, then decrease to 1 spray in each nare daily.   hydrOXYzine  25 MG tablet Commonly known as: ATARAX  1 tablet by mouth 1-2 times a day as needed for eczema flares.   lamoTRIgine 25 MG tablet Commonly known as: LAMICTAL Take 25 mg by mouth.   LORazepam 0.5 MG tablet Commonly known as: ATIVAN Take 0.5 mg by mouth.   melatonin 3 MG Tabs tablet Take 6 mg by mouth.   mometasone  0.1 % ointment Commonly known as: ELOCON  Apply topically daily.   ondansetron  4 MG disintegrating tablet Commonly known as: ZOFRAN -ODT Take  1 tablet (4 mg total) by mouth every 8 (eight) hours as needed for nausea or vomiting.   pimecrolimus  1 % cream Commonly known as: Elidel  Apply topically 2 (two) times daily.   Spacer/Aero-Holding Raguel French Symbicort  2 puffs twice a day with spacer to help prevent cough and wheeze. Rinse mouth out afterwards.   traMADol 50 MG tablet Commonly known as: ULTRAM   traZODone 50 MG tablet Commonly known as: DESYREL Take 75 mg by mouth at bedtime.   Vitamin D3 10 MCG (400 UNIT) tablet Take 1 tablet (400 Units total) by mouth daily.    Past Medical History:  Diagnosis Date   Acid reflux    Asthma    Bronchitis    Chronic back pain    Depression    Eczema     Past Surgical History:  Procedure Laterality Date   LEG SURGERY     tib fib left?    Review of systems negative except as noted in HPI / PMHx or noted below:  Review of Systems  Constitutional: Negative.   HENT: Negative.    Eyes: Negative.   Respiratory: Negative.    Cardiovascular: Negative.   Gastrointestinal: Negative.   Genitourinary: Negative.   Musculoskeletal: Negative.   Skin: Negative.   Neurological: Negative.   Endo/Heme/Allergies: Negative.   Psychiatric/Behavioral: Negative.       Objective:   Vitals:   09/10/24 0840  BP: 124/88  Pulse: 79  Resp: 16  Temp: 98.1 F (36.7 C)  SpO2: 100%   Height: 5' 6 (167.6 cm)  Weight: 183 lb 12.8 oz (83.4 kg)   Physical Exam Constitutional:      Appearance: She is not diaphoretic.  HENT:     Head: Normocephalic.     Right Ear: Tympanic membrane, ear canal and external ear normal.     Left Ear: Tympanic membrane, ear canal and external ear normal.     Nose: Nose normal. No mucosal edema or rhinorrhea.     Mouth/Throat:     Pharynx: Uvula midline. No oropharyngeal exudate.  Eyes:     Conjunctiva/sclera: Conjunctivae normal.  Neck:     Thyroid: No thyromegaly.     Trachea: Trachea normal. No tracheal tenderness or tracheal deviation.   Cardiovascular:     Rate and Rhythm: Normal rate and regular rhythm.     Heart sounds: Normal heart sounds, S1 normal and S2 normal. No murmur heard. Pulmonary:     Effort: No respiratory distress.     Breath sounds: Normal breath sounds. No stridor. No wheezing or rales.  Lymphadenopathy:     Head:     Right side of head: No tonsillar adenopathy.     Left side of head: No tonsillar adenopathy.     Cervical: No cervical adenopathy.  Skin:    Findings: No erythema or rash.     Nails: There is no clubbing.  Neurological:     Mental Status: She is alert.     Diagnostics: Spirometry was performed and demonstrated an FEV1 of 2.19 at 85 % of predicted.  Assessment and Plan:   1. Other atopic dermatitis   2. Asthma, moderate persistent, well-controlled   3. Allergy  with anaphylaxis due to food   4. Steroid-induced osteoporosis    1.  START nemolizumab injections.  2.  Continue Symbicort  160 - 2 inhalations twice a day with spacer   3.  If needed:  A. Hydroxyzine  25 mg - 1 tablet 1-2 times per day  B. Bath/shower followed by Elidel  + mometasone  0.1% ointment    C. Albuterol  HFA - 2 inhalations every 4-6 hours  D. Epi-Pen, benadryl , MD/ER evaluation for allergic reaction  4.  Continue to treat osteoporosis:  A. Vitamin D 400 units/day plus calcium 2 g/day  B. Alendronate  35 mg 1 time per week   5. Influenza = Tamiflu. Covid = Paxlovid  6. Return to clinic in 12 weeks or earlier if problem. Change treatment for atopic dermatitis???  Maecy appears to be doing pretty well with her airway but her skin is a mess and we need to start her on her anti-IL 33 monoclonal antibody and have her use that consistently for 12 weeks and then assess her response to that plan..  She can continue on topical agents which have really not provided her much improvement regarding her atopic dermatitis.  She will continue on therapy directed against inflammation of her airway and continue on therapy  directed against osteoporosis.  Camellia Denis, MD Allergy  / Immunology [facility]

## 2024-09-11 ENCOUNTER — Encounter: Payer: Self-pay | Admitting: Allergy and Immunology

## 2024-10-03 ENCOUNTER — Other Ambulatory Visit: Payer: Self-pay | Admitting: *Deleted

## 2024-10-03 DIAGNOSIS — L2089 Other atopic dermatitis: Secondary | ICD-10-CM

## 2024-10-03 MED ORDER — HYDROXYZINE HCL 25 MG PO TABS: ORAL_TABLET | ORAL | 1 refills | Status: AC

## 2024-12-03 ENCOUNTER — Ambulatory Visit: Admitting: Allergy and Immunology

## 2024-12-16 ENCOUNTER — Other Ambulatory Visit: Payer: Self-pay

## 2025-02-18 ENCOUNTER — Ambulatory Visit: Admitting: Allergy and Immunology
